# Patient Record
Sex: Male | Born: 1965 | Race: Black or African American | Hispanic: No | Marital: Single
Health system: Midwestern US, Community
[De-identification: ages and names within clinical notes are randomized; demographics above are authoritative.]

## PROBLEM LIST (undated history)

## (undated) DIAGNOSIS — F1910 Other psychoactive substance abuse, uncomplicated: Secondary | ICD-10-CM

## (undated) DIAGNOSIS — I10 Essential (primary) hypertension: Secondary | ICD-10-CM

## (undated) DIAGNOSIS — M1990 Unspecified osteoarthritis, unspecified site: Secondary | ICD-10-CM

## (undated) DIAGNOSIS — I219 Acute myocardial infarction, unspecified: Secondary | ICD-10-CM

## (undated) HISTORY — PX: NECK SURGERY: SHX720

---

## 2014-12-09 NOTE — ED Provider Notes (Signed)
PATIENT:          Jorge Noble, Jorge Noble         DOS:           12/09/2014  MR #:             [id number]             ACCOUNT #:     [id number]  DATE OF BIRTH:    July 17, 1966              AGE:           49      HISTORY OF PRESENT ILLNESS:    PERTINENT HISTORY OF PRESENT  ILLNESS. Patient was seen in  collaboration with  Dr. Jolayne Pantheronstant. Patient tripped and fell down stairs 6 hours ago,  lacerating his  right large toe.  States his tetanus is up to date.  Denies any other  symptoms  at this time.  Review of systems otherwise negative.        PHYSICAL EXAM Vitals within normal limits.  Heart is regular without  murmurs.   Lungs are clear bilaterally.  Abdomen soft and nontender.  Patient  does not  appear in any acute distress.  There seems to be no rash.  There are  no gross  focal neurological deficits.  Neck is supple.  Patient has full range  of motion  of the right large toe.  There is a crescent shaped flap of skin  approximately  3 cm in length.  Extremities neurovascularly intact with good  capillary refill.   The remainder of the physical exam is unremarkable.    MEDICAL DECISION MAKING:    SIGNIFICANT FINDINGS/ED COURSE/MEDICAL DECISION MAKING/TREATMENT  PLAN Tetanus  is up-to-date.  Patient given Norco for symptom management.  X-ray of  the right  foot shows no acute process.  Suture repair performed with 4-0 nylon  suture.  Wound was anesthetized with lidocaine, cleansed with Shur-Clens,  irrigated with  normal saline solution, and prepped with Betadine.  7 simple sutures  were  placed.  Patient tolerated well.  Bacitracin, dry sterile dressing,  postoperative shoe applied.  Patient received a work note.  He will  follow-up  with his primary care physician for further evaluation, treatment, and  suture  removal.  Results discussed in layman's terms to patient satisfaction.  All  questions answered in layman's terms.  Pt understands importance of  followup  care as directed.  The patient has been instructed to  return to ED to  if new  symptoms, problems, or questions.  We mutually agreed with the plan  of  disposition.    PROBLEM LIST:       Admit Reason:     Fall right tie lac: Entered Date: 09-Dec-2014 07:25, Entered By:  Standley BrookingINTERFACES,  INTERFACES, Status: Active        DIAGNOSIS 1.  Laceration, right large toe, 3 cm  2.  Suture repair        ADDITIONAL INFORMATION If the physician assistant/nurse practitioner  was  involved in patient care, I personally performed and participated in  all the  above services (including HPI and PE). I have reviewed with the  physician  assistant/nurse practitioner the history and confirmed the findings  with the  patient. I personally performed all surgical procedures in the medical  record  unless otherwise indicated.    Electronic Signatures:  CONSTANT, Ronda FairlyNICHOLAS J (MD)  (Signed 09-Dec-2014 16:52)   Authored: HISTORY OF  PRESENT ILLNESS, PHYSICAL EXAM, MEDICAL DECISION  MAKING   Co-Signer: HISTORY OF PRESENT ILLNESS, PHYSICAL EXAM, MEDICAL  DECISION MAKING,  PROBLEM LIST, DIAGNOSIS, Additional Infomation  Kendall FlackHENKELS, Felicha Frayne M (NP)  (Signed 09-Dec-2014 09:49)   Authored: HISTORY OF PRESENT ILLNESS, PHYSICAL EXAM, MEDICAL DECISION  MAKING,  PROBLEM LIST, DIAGNOSIS, Additional Infomation      Last Updated: 09-Dec-2014 16:52 by CONSTANT, Ronda FairlyNICHOLAS J (MD)            Please see T-Sheet, initial assessment, and physician orders for  further details.    Dictating Physician: Kendall FlackKatherine M Atlanta Pelto, CNP  Original Electronic Signature Date: 12/09/2014 07:52 A  [facility]  Document #: [id number]    cc:  PCP No       So[place]

## 2016-05-05 ENCOUNTER — Observation Stay (HOSPITAL_COMMUNITY)
Admission: EM | Admit: 2016-05-05 | Discharge: 2016-05-07 | Disposition: A | Discharge status: Home / Self Care | Payer: Medicaid - Out of State | Attending: Internal Medicine | Admitting: Internal Medicine

## 2016-05-05 ENCOUNTER — Encounter (HOSPITAL_COMMUNITY): Payer: Self-pay

## 2016-05-05 ENCOUNTER — Emergency Department (HOSPITAL_COMMUNITY): Payer: Medicaid - Out of State

## 2016-05-05 ENCOUNTER — Observation Stay (HOSPITAL_BASED_OUTPATIENT_CLINIC_OR_DEPARTMENT_OTHER): Payer: Medicaid - Out of State

## 2016-05-05 DIAGNOSIS — N183 Chronic kidney disease, stage 3 (moderate): Secondary | ICD-10-CM | POA: Insufficient documentation

## 2016-05-05 DIAGNOSIS — I429 Cardiomyopathy, unspecified: Secondary | ICD-10-CM | POA: Diagnosis not present

## 2016-05-05 DIAGNOSIS — F1410 Cocaine abuse, uncomplicated: Secondary | ICD-10-CM | POA: Diagnosis present

## 2016-05-05 DIAGNOSIS — Z833 Family history of diabetes mellitus: Secondary | ICD-10-CM | POA: Diagnosis not present

## 2016-05-05 DIAGNOSIS — I517 Cardiomegaly: Secondary | ICD-10-CM | POA: Diagnosis not present

## 2016-05-05 DIAGNOSIS — R079 Chest pain, unspecified: Secondary | ICD-10-CM | POA: Diagnosis present

## 2016-05-05 DIAGNOSIS — I248 Other forms of acute ischemic heart disease: Secondary | ICD-10-CM | POA: Diagnosis present

## 2016-05-05 DIAGNOSIS — I2489 Other forms of acute ischemic heart disease: Secondary | ICD-10-CM | POA: Diagnosis present

## 2016-05-05 DIAGNOSIS — I351 Nonrheumatic aortic (valve) insufficiency: Secondary | ICD-10-CM | POA: Diagnosis not present

## 2016-05-05 DIAGNOSIS — T405X1A Poisoning by cocaine, accidental (unintentional), initial encounter: Secondary | ICD-10-CM | POA: Insufficient documentation

## 2016-05-05 DIAGNOSIS — F17210 Nicotine dependence, cigarettes, uncomplicated: Secondary | ICD-10-CM | POA: Insufficient documentation

## 2016-05-05 DIAGNOSIS — G8929 Other chronic pain: Secondary | ICD-10-CM | POA: Diagnosis present

## 2016-05-05 DIAGNOSIS — M25512 Pain in left shoulder: Secondary | ICD-10-CM | POA: Diagnosis present

## 2016-05-05 DIAGNOSIS — R0600 Dyspnea, unspecified: Secondary | ICD-10-CM | POA: Insufficient documentation

## 2016-05-05 DIAGNOSIS — R778 Other specified abnormalities of plasma proteins: Secondary | ICD-10-CM | POA: Diagnosis present

## 2016-05-05 DIAGNOSIS — I160 Hypertensive urgency: Principal | ICD-10-CM | POA: Diagnosis present

## 2016-05-05 DIAGNOSIS — N179 Acute kidney failure, unspecified: Secondary | ICD-10-CM | POA: Diagnosis present

## 2016-05-05 DIAGNOSIS — E785 Hyperlipidemia, unspecified: Secondary | ICD-10-CM | POA: Diagnosis not present

## 2016-05-05 DIAGNOSIS — Z720 Tobacco use: Secondary | ICD-10-CM | POA: Diagnosis present

## 2016-05-05 DIAGNOSIS — I129 Hypertensive chronic kidney disease with stage 1 through stage 4 chronic kidney disease, or unspecified chronic kidney disease: Secondary | ICD-10-CM | POA: Insufficient documentation

## 2016-05-05 DIAGNOSIS — K5900 Constipation, unspecified: Secondary | ICD-10-CM | POA: Insufficient documentation

## 2016-05-05 DIAGNOSIS — R7989 Other specified abnormal findings of blood chemistry: Secondary | ICD-10-CM | POA: Diagnosis present

## 2016-05-05 DIAGNOSIS — Z8249 Family history of ischemic heart disease and other diseases of the circulatory system: Secondary | ICD-10-CM | POA: Diagnosis not present

## 2016-05-05 DIAGNOSIS — Z79899 Other long term (current) drug therapy: Secondary | ICD-10-CM | POA: Diagnosis not present

## 2016-05-05 DIAGNOSIS — I10 Essential (primary) hypertension: Secondary | ICD-10-CM

## 2016-05-05 HISTORY — DX: Essential (primary) hypertension: I10

## 2016-05-05 LAB — CBC
HCT: 41.5 % (ref 39.0–52.0)
HEMOGLOBIN: 14.2 g/dL (ref 13.0–17.0)
MCH: 28.6 pg (ref 26.0–34.0)
MCHC: 34.2 g/dL (ref 30.0–36.0)
MCV: 83.7 fL (ref 78.0–100.0)
Platelets: 297 10*3/uL (ref 150–400)
RBC: 4.96 MIL/uL (ref 4.22–5.81)
RDW: 16.5 % — ABNORMAL HIGH (ref 11.5–15.5)
WBC: 6.7 10*3/uL (ref 4.0–10.5)

## 2016-05-05 LAB — BASIC METABOLIC PANEL
ANION GAP: 8 (ref 5–15)
BUN: 21 mg/dL — ABNORMAL HIGH (ref 6–20)
CALCIUM: 9.6 mg/dL (ref 8.9–10.3)
CO2: 24 mmol/L (ref 22–32)
Chloride: 107 mmol/L (ref 101–111)
Creatinine, Ser: 2.29 mg/dL — ABNORMAL HIGH (ref 0.61–1.24)
GFR calc non Af Amer: 32 mL/min — ABNORMAL LOW (ref >60)
GFR, EST AFRICAN AMERICAN: 37 mL/min — AB (ref >60)
Glucose, Bld: 94 mg/dL (ref 65–99)
Potassium: 4.3 mmol/L (ref 3.5–5.1)
Sodium: 139 mmol/L (ref 135–145)

## 2016-05-05 LAB — TROPONIN I
TROPONIN I: 0.13 ng/mL — AB (ref <0.03)
TROPONIN I: 0.13 ng/mL — AB (ref <0.03)

## 2016-05-05 LAB — ECHOCARDIOGRAM COMPLETE
HEIGHTINCHES: 72 in
Weight: 4400 oz

## 2016-05-05 LAB — I-STAT TROPONIN, ED: TROPONIN I, POC: 0.04 ng/mL (ref 0.00–0.08)

## 2016-05-05 MED ORDER — ATORVASTATIN CALCIUM 10 MG PO TABS
20.0000 mg | ORAL_TABLET | Freq: Every day | ORAL | Status: DC
  Administered 2016-05-05 – 2016-05-07 (×3): 20 mg via ORAL
  Filled 2016-05-05 (×3): qty 2

## 2016-05-05 MED ORDER — LISINOPRIL 20 MG PO TABS
20.0000 mg | ORAL_TABLET | Freq: Every day | ORAL | Status: DC
  Filled 2016-05-05 (×2): qty 1

## 2016-05-05 MED ORDER — HYDROCODONE-ACETAMINOPHEN 5-325 MG PO TABS
1.0000 | ORAL_TABLET | Freq: Once | ORAL | Status: AC
  Administered 2016-05-05: 1 via ORAL
  Filled 2016-05-05: qty 1

## 2016-05-05 MED ORDER — NITROGLYCERIN 2 % TD OINT
1.0000 [in_us] | TOPICAL_OINTMENT | Freq: Four times a day (QID) | TRANSDERMAL | Status: DC
  Administered 2016-05-05 – 2016-05-06 (×3): 1 [in_us] via TOPICAL
  Filled 2016-05-05: qty 30

## 2016-05-05 MED ORDER — NITROGLYCERIN 2 % TD OINT
1.0000 [in_us] | TOPICAL_OINTMENT | Freq: Once | TRANSDERMAL | Status: AC
  Administered 2016-05-05: 1 [in_us] via TOPICAL
  Filled 2016-05-05: qty 1

## 2016-05-05 MED ORDER — ENOXAPARIN SODIUM 40 MG/0.4ML ~~LOC~~ SOLN
40.0000 mg | SUBCUTANEOUS | Status: DC
  Administered 2016-05-05 – 2016-05-06 (×2): 40 mg via SUBCUTANEOUS
  Filled 2016-05-05 (×2): qty 0.4

## 2016-05-05 MED ORDER — MORPHINE SULFATE (PF) 2 MG/ML IV SOLN
2.0000 mg | INTRAVENOUS | Status: DC | PRN
  Administered 2016-05-05 – 2016-05-07 (×4): 2 mg via INTRAVENOUS
  Filled 2016-05-05 (×4): qty 1

## 2016-05-05 MED ORDER — HYDRALAZINE HCL 25 MG PO TABS
25.0000 mg | ORAL_TABLET | Freq: Once | ORAL | Status: AC
  Administered 2016-05-05: 25 mg via ORAL
  Filled 2016-05-05: qty 1

## 2016-05-05 MED ORDER — ONDANSETRON HCL 4 MG/2ML IJ SOLN: 4.0000 mg | Freq: Four times a day (QID) | INTRAMUSCULAR | Status: DC | PRN

## 2016-05-05 MED ORDER — CLONIDINE HCL 0.1 MG PO TABS
0.2000 mg | ORAL_TABLET | Freq: Two times a day (BID) | ORAL | Status: DC
  Administered 2016-05-05 – 2016-05-06 (×3): 0.2 mg via ORAL
  Filled 2016-05-05 (×3): qty 2

## 2016-05-05 MED ORDER — HYDRALAZINE HCL 25 MG PO TABS
25.0000 mg | ORAL_TABLET | Freq: Four times a day (QID) | ORAL | Status: DC | PRN
  Administered 2016-05-05: 25 mg via ORAL
  Filled 2016-05-05: qty 1

## 2016-05-05 MED ORDER — ASPIRIN 81 MG PO CHEW
324.0000 mg | CHEWABLE_TABLET | Freq: Once | ORAL | Status: AC
  Administered 2016-05-05: 324 mg via ORAL
  Filled 2016-05-05: qty 4

## 2016-05-05 MED ORDER — OXYCODONE-ACETAMINOPHEN 5-325 MG PO TABS
1.0000 | ORAL_TABLET | Freq: Four times a day (QID) | ORAL | Status: DC | PRN
  Administered 2016-05-05 – 2016-05-06 (×4): 1 via ORAL
  Filled 2016-05-05 (×4): qty 1

## 2016-05-05 MED ORDER — AMLODIPINE BESYLATE 10 MG PO TABS
10.0000 mg | ORAL_TABLET | Freq: Every day | ORAL | Status: DC
  Administered 2016-05-05 – 2016-05-07 (×3): 10 mg via ORAL
  Filled 2016-05-05: qty 1
  Filled 2016-05-05: qty 2
  Filled 2016-05-05: qty 1

## 2016-05-05 MED ORDER — ACETAMINOPHEN 325 MG PO TABS: 650.0000 mg | ORAL_TABLET | ORAL | Status: DC | PRN

## 2016-05-05 MED ORDER — HYDRALAZINE HCL 50 MG PO TABS
50.0000 mg | ORAL_TABLET | Freq: Four times a day (QID) | ORAL | Status: DC
  Administered 2016-05-05 – 2016-05-06 (×3): 50 mg via ORAL
  Filled 2016-05-05 (×3): qty 1

## 2016-05-05 NOTE — Progress Notes (Signed)
*  PRELIMINARY RESULTS* Echocardiogram 2D Echocardiogram has been performed.  [name], Louis Yang 05/05/2016, 2:57 PM

## 2016-05-05 NOTE — ED Notes (Signed)
Pt can go to floor at 11:47

## 2016-05-05 NOTE — Progress Notes (Signed)
CRITICAL VALUE ALERT  Critical value received:  Troponin 0.13  Date of notification: 05/05/16  Time of notification:  1440  Critical value read back:Yes.    Nurse who received alert:  D.Levii Hairfield,RN  MD notified (1st page):  Dr. Sharl MaLama  Time of first page:  1440  MD notified (2nd page):  Time of second page:  Responding MD:  Dr. Sharl MaLama  Time MD responded:  [phone]

## 2016-05-05 NOTE — H&P (Signed)
[facility] H&P    Patient Demographics:    Louis Yang, is a 50 y.o. male  MRN: [id number]  DOB - 01/11/1966  Admit Date - 05/05/2016  Referring MD/NP/PA: Dr Clarene DukeLittle  Outpatient Primary MD for the patient is No primary care provider on file.  Patient coming from: home   Chief Complaint  Patient presents with  . Chest Pain      HPI:    Louis Yang  is a 50 y.o. male, with h/o hypertension, CKD stage III who came to the hospital with chest pain started this morning. Also patient has been complaining of shortness of breath for past 2 days. Patient has a history of hypertension and is on antihypertensive treatment. He has moved from [place] recently. He has been out of his medications for past 1 week. He also complains of left shoulder pain, x-ray of the shoulder shows arthropathy. He denies nausea had one episode of vomiting 2 days ago. Denies diarrhea. Does complain of bloating Denies dysuria, no fever.    Review of systems:    In addition to the HPI above,  No Fever-chills, No Headache, No changes with Vision or hearing, No problems swallowing food or Liquids, No Blood in stool or Urine, No dysuria, No new skin rashes or bruises, No new joints pains-aches,  No new weakness, tingling, numbness in any extremity, No recent weight gain or loss, No polyuria, polydypsia or polyphagia, No significant Mental Stressors.  A full 10 point Review of Systems was done, except as stated above, all other Review of Systems were negative.   With Past History of the following :    Past Medical History:  Diagnosis Date  . Hypertension       Past Surgical History:  Procedure Laterality Date  . NECK SURGERY        Social History:      Social History  Substance Use Topics  . Smoking status: Current Every Day Smoker    Types: Cigarettes  . Smokeless tobacco: Never Used  . Alcohol use Yes   Comment: social       Family History :     Family History  Problem Relation Age of Onset  . Hypertension Mother   . Hypertension Father   . Diabetes Father       Home Medications:   Prior to Admission medications   Medication Sig Start Date End Date Taking? Authorizing Provider  amLODipine (NORVASC) 10 MG tablet Take 10 mg by mouth daily.   Yes Historical Provider, MD  cloNIDine (CATAPRES) 0.2 MG tablet Take 0.2 mg by mouth 2 (two) times daily.   Yes Historical Provider, MD  atorvastatin (LIPITOR) 20 MG tablet Take 20 mg by mouth daily.    Historical Provider, MD  lisinopril (PRINIVIL,ZESTRIL) 20 MG tablet Take 20 mg by mouth daily.    Historical Provider, MD     Allergies:    No Known Allergies   Physical Exam:   Vitals  Blood pressure (!) 192/122, pulse 65, temperature 98.5 F (36.9 C), temperature source Oral, resp.  rate 15, SpO2 100 %.  1.  General: Well-built African-American male in no acute distress  2. Psychiatric:  Intact judgement and  insight, awake alert, oriented x 3.  3. Neurologic: No focal neurological deficits, all cranial nerves intact.Strength 5/5 all 4 extremities, sensation intact all 4 extremities, plantars down going.  4. Eyes :  anicteric sclerae, moist conjunctivae with no lid lag. PERRLA.  5. ENMT:  Oropharynx clear with moist mucous membranes and good dentition  6. Neck:  supple, no cervical lymphadenopathy appriciated, No thyromegaly  7. Respiratory : Normal respiratory effort, good air movement bilaterally,clear to  auscultation bilaterally  8. Cardiovascular : RRR, no gallops, rubs or murmurs, no leg edema  9. Gastrointestinal:  Positive bowel sounds, abdomen soft, non-tender to palpation,no hepatosplenomegaly, no rigidity or guarding       10. Skin:  No cyanosis, normal texture and turgor, no rash, lesions or ulcers  11.Musculoskeletal:  Left shoulder range of motion limited on abduction, left shoulder joint tender to  palpation    Data Review:    CBC  Recent Labs Lab 05/05/16 0830  WBC 6.7  HGB 14.2  HCT 41.5  PLT 297  MCV 83.7  MCH 28.6  MCHC 34.2  RDW 16.5*   ------------------------------------------------------------------------------------------------------------------  Chemistries   Recent Labs Lab 05/05/16 0830  NA 139  K 4.3  CL 107  CO2 24  GLUCOSE 94  BUN 21*  CREATININE 2.29*  CALCIUM 9.6   ------------------------------------------------------------------------------------------------------------------  ------------------------------------------------------------------------------------------------------------------ GFR: CrCl cannot be calculated (Unknown ideal weight.). L --------------------------------------------------------------------------------------------------------------- Urine analysis: No results found for: COLORURINE, APPEARANCEUR, LABSPEC, PHURINE, GLUCOSEU, HGBUR, BILIRUBINUR, KETONESUR, PROTEINUR, UROBILINOGEN, NITRITE, LEUKOCYTESUR    Imaging Results:    Dg Chest 2 View  Result Date: 05/05/2016 CLINICAL DATA:  Acute mid to left chest pain with cough EXAM: CHEST  2 VIEW COMPARISON:  None available FINDINGS: Mild cardiac enlargement without focal pneumonia, edema, CHF, effusion or pneumothorax. Trachea is midline. Aorta is elongated and tortuous. No acute osseous finding. Minor degenerative spondylosis of the spine. IMPRESSION: Mild cardiomegaly without acute chest process Electronically Signed   By: Judie Petit.  Shick M.D.   On: 05/05/2016 09:04   Dg Shoulder Left  Result Date: 05/05/2016 CLINICAL DATA:  Progressive pain and decreased range of motion EXAM: LEFT SHOULDER - 2+ VIEW COMPARISON:  January 18, 2016 FINDINGS: Frontal, Y scapular, and axillary images were obtained. There is no evident acute fracture or dislocation. A Hill-Sachs defect is noted in the lateral humeral head region, best seen on the axillary image, stable. 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 The glenohumeral joint appears unremarkable. Visualized left lung is clear. 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 Hill-Sachs defect consistent with prior anterior dislocation. No acute fracture or dislocation. Electronically Signed   By: Bretta Bang III M.D.   On: 05/05/2016 10:39    My personal review of EKG: Rhythm NSR, T-wave inversions in lead V4 V5 V6   & Plan:    Active Problems:   Chest pain   1. Chest pain- placed under observation, cycle cardiac enzymes. Patient has a history of cocaine abuse, did cocaine 3 days ago. We will obtain echocardiogram in a.m. start Nitropaste 1 inch every 6 hours 2. Left shoulder pain-x-ray of shoulder showed arthropathy, no fracture or dislocation. 3. Hypertension- continue amlodipine, Catapres. Will need adjustment of medications before discharge 4. Patient has no PCP and will need an appointment [facility] before discharge   DVT Prophylaxis-   Lovenox   AM Labs Ordered, also please review Full Orders  Family Communication: No family at bedside  Code Status:  Full code  Admission status: Observation  Time spent in minutes : 60 min   Evadene Wardrip S M.D on 05/05/2016 at 12:03 PM  Between 7am to 7pm - Pager - [phone]. After 7pm go to [email or web address] - password [facility]  [facility] - Office  [phone]

## 2016-05-05 NOTE — ED Provider Notes (Signed)
[facility]-EMERGENCY DEPT Provider Note   CSN: [id number] Arrival date & time: 05/05/16  0751     History   Chief Complaint Chief Complaint  Patient presents with  . Chest Pain    HPI Louis Yang is a 50 y.o. male.  50 year old male with past medical history including hypertension and substance abuse who presents with chest pain and left shoulder pain. The patient reports a long-standing history of chronic left shoulder pain. He had a steroid injection in the past which helped temporarily but recently his shoulder pain has been worse. 2 days ago while he was mowing the lawn, he had a gradual onset of worsening left-sided chest pain which she describes as a pressure with some pinching sensation. The pain has been intermittent and associated with some shortness of breath. He also reports some bloating sensation in his abdomen and early satiety recently. He has had increased urination recently but no dysuria. He has been out of his blood pressure medications for the past one week. He had one episode of vomiting yesterday, no diarrhea but he does endorse constipation. Last bowel movement was this morning. He admits to cocaine use, last use was 3 days ago. He states that on the day that his chest pain began he had not used any cocaine and has not used any since it started. No family history of heart disease. No recent travel, leg swelling, history of blood clots, or history of cancer.   The history is provided by the patient.    Past Medical History:  Diagnosis Date  . Hypertension     There are no active problems to display for this patient.   History reviewed. No pertinent surgical history.     Home Medications    Prior to Admission medications   Not on File    Family History History reviewed. No pertinent family history.  Social History Social History  Substance Use Topics  . Smoking status: Current Every Day Smoker  . Smokeless tobacco: Never Used  . Alcohol use Yes    Comment: social     Allergies   Review of patient's allergies indicates no known allergies.   Review of Systems Review of Systems 10 Systems reviewed and are negative for acute change except as noted in the HPI.   Physical Exam Updated Vital Signs BP (!) 185/123   Pulse 66   Temp 98.5 F (36.9 C) (Oral)   Resp 12   SpO2 100%   Physical Exam  Constitutional: He is oriented to person, place, and time. He appears well-developed and well-nourished. No distress.  HENT:  Head: Normocephalic and atraumatic.  Moist mucous membranes  Eyes: Conjunctivae are normal. Pupils are equal, round, and reactive to light.  Neck: Neck supple.  Cardiovascular: Normal rate, regular rhythm, normal heart sounds and intact distal pulses.   No murmur heard. Pulmonary/Chest: Effort normal and breath sounds normal.  Abdominal: Soft. Bowel sounds are normal. He exhibits no distension. There is no tenderness.  Musculoskeletal: He exhibits edema (trace BLE).  Limited ROM L shoulder due to pain; TTP anterior L shoulder without deformity, warmth, swelling  Neurological: He is alert and oriented to person, place, and time.  Fluent speech  Skin: Skin is warm and dry.  Psychiatric: He has a normal mood and affect. Judgment normal.  Nursing note and vitals reviewed.    ED Treatments / Results  Labs (all labs ordered are listed, but only abnormal results are displayed) Labs Reviewed  BASIC METABOLIC PANEL - Abnormal;  Notable for the following:       Result Value   BUN 21 (*)    Creatinine, Ser 2.29 (*)    GFR calc non Af Amer 32 (*)    GFR calc Af Amer 37 (*)    All other components within normal limits  CBC - Abnormal; Notable for the following:    RDW 16.5 (*)    All other components within normal limits  I-STAT TROPOININ, ED    EKG  EKG Interpretation  Date/Time:  Wednesday May 05 2016 08:04:49 EDT Ventricular Rate:  77 PR Interval:    QRS Duration: 184 QT Interval:  520 QTC  Calculation: 589 R Axis:   80 Text Interpretation:  Ectopic atrial rhythm Short PR interval Nonspecific intraventricular conduction delay Borderline repol abnormality, diffuse leads T wave inversions in lateral leads, no previous ekg available Confirmed by Tishawna Larouche MD, Ulrick Methot [phone]([id number]) on 05/05/2016 8:15:20 AM       Radiology Dg Chest 2 View  Result Date: 05/05/2016 CLINICAL DATA:  Acute mid to left chest pain with cough EXAM: CHEST  2 VIEW COMPARISON:  None available FINDINGS: Mild cardiac enlargement without focal pneumonia, edema, CHF, effusion or pneumothorax. Trachea is midline. Aorta is elongated and tortuous. No acute osseous finding. Minor degenerative spondylosis of the spine. IMPRESSION: Mild cardiomegaly without acute chest process Electronically Signed   By: Judie PetitM.  Shick M.D.   On: 05/05/2016 09:04    Procedures Procedures (including critical care time)  Medications Ordered in ED Medications  aspirin chewable tablet 324 mg (324 mg Oral Given 05/05/16 0827)  nitroGLYCERIN (NITROGLYN) 2 % ointment 1 inch (1 inch Topical Given 05/05/16 1001)  HYDROcodone-acetaminophen (NORCO/VICODIN) 5-325 MG per tablet 1 tablet (1 tablet Oral Given 05/05/16 1000)     Initial Impression / Assessment and Plan / ED Course  I have reviewed the triage vital signs and the nursing notes.  Pertinent labs & imaging results that were available during my care of the patient were reviewed by me and considered in my medical decision making (see chart for details).  Clinical Course   Pt w/ 2 days of intermittent L CP assoc w/ shortness of breath. Does admit to cocaine but denies any cocaine use around the onset of his chest pain. He was awake and alert, comfortable on exam. Vital signs notable for hypertension at 185/123. Reported his pain was currently 5/10. EKG with T-wave inversions in lateral leads, no previous available for comparison. Labs show negative troponin, creatinine 2.29 with no previous available for  comparison. Gave the patient Nitropaste and Norco for his shoulder pain. I'm concerned about the patient's elevated creatinine, elevated blood pressure, and abnormal EKG in the setting of chest pain. I have no previous documentation to compare to as the patient recently moved here. He is at higher risk for ACS given uncontrolled BP, cocaine use, and age. PE unlikely as he has no risk factors and O2 100% on RA. Discussed with Dr. Sharl MaLama, hospitalist, and pt admitted for cardiac work up.  Final Clinical Impressions(s) / ED Diagnoses   Final diagnoses:  Chest pain, unspecified type  Chronic left shoulder pain  Hypertension, unspecified type  AKI (acute kidney injury) [facility](HCC)    New Prescriptions New Prescriptions   No medications on file     Laurence Spatesachel Morgan Lorain Fettes, MD 05/05/16 1424

## 2016-05-05 NOTE — ED Triage Notes (Addendum)
Chest pain x 2 days.  Down left shoulder. Shortness of breath.  Pt states pain and bloating in abdomen. Denies physical exertion.  Some slight cough.  bp high in triage.  Pt has not been taking his meds.

## 2016-05-06 DIAGNOSIS — R748 Abnormal levels of other serum enzymes: Secondary | ICD-10-CM

## 2016-05-06 DIAGNOSIS — N179 Acute kidney failure, unspecified: Secondary | ICD-10-CM

## 2016-05-06 DIAGNOSIS — I160 Hypertensive urgency: Secondary | ICD-10-CM | POA: Diagnosis present

## 2016-05-06 DIAGNOSIS — E785 Hyperlipidemia, unspecified: Secondary | ICD-10-CM | POA: Diagnosis present

## 2016-05-06 DIAGNOSIS — R7989 Other specified abnormal findings of blood chemistry: Secondary | ICD-10-CM | POA: Diagnosis present

## 2016-05-06 DIAGNOSIS — G8929 Other chronic pain: Secondary | ICD-10-CM | POA: Diagnosis present

## 2016-05-06 DIAGNOSIS — E784 Other hyperlipidemia: Secondary | ICD-10-CM

## 2016-05-06 DIAGNOSIS — I2489 Other forms of acute ischemic heart disease: Secondary | ICD-10-CM | POA: Diagnosis present

## 2016-05-06 DIAGNOSIS — R079 Chest pain, unspecified: Secondary | ICD-10-CM | POA: Diagnosis not present

## 2016-05-06 DIAGNOSIS — F1410 Cocaine abuse, uncomplicated: Secondary | ICD-10-CM | POA: Diagnosis present

## 2016-05-06 DIAGNOSIS — I517 Cardiomegaly: Secondary | ICD-10-CM

## 2016-05-06 DIAGNOSIS — I248 Other forms of acute ischemic heart disease: Secondary | ICD-10-CM | POA: Diagnosis present

## 2016-05-06 DIAGNOSIS — M25512 Pain in left shoulder: Secondary | ICD-10-CM

## 2016-05-06 DIAGNOSIS — T405X1A Poisoning by cocaine, accidental (unintentional), initial encounter: Secondary | ICD-10-CM | POA: Diagnosis not present

## 2016-05-06 DIAGNOSIS — Z720 Tobacco use: Secondary | ICD-10-CM | POA: Diagnosis present

## 2016-05-06 DIAGNOSIS — R778 Other specified abnormalities of plasma proteins: Secondary | ICD-10-CM | POA: Diagnosis present

## 2016-05-06 LAB — URINE MICROSCOPIC-ADD ON
Bacteria, UA: NONE SEEN
RBC / HPF: NONE SEEN RBC/hpf (ref 0–5)
Squamous Epithelial / LPF: NONE SEEN

## 2016-05-06 LAB — BASIC METABOLIC PANEL
ANION GAP: 5 (ref 5–15)
BUN: 28 mg/dL — AB (ref 6–20)
CO2: 29 mmol/L (ref 22–32)
Calcium: 9.6 mg/dL (ref 8.9–10.3)
Chloride: 107 mmol/L (ref 101–111)
Creatinine, Ser: 2.27 mg/dL — ABNORMAL HIGH (ref 0.61–1.24)
GFR calc Af Amer: 37 mL/min — ABNORMAL LOW (ref >60)
GFR, EST NON AFRICAN AMERICAN: 32 mL/min — AB (ref >60)
GLUCOSE: 84 mg/dL (ref 65–99)
POTASSIUM: 3.9 mmol/L (ref 3.5–5.1)
Sodium: 141 mmol/L (ref 135–145)

## 2016-05-06 LAB — URINALYSIS, ROUTINE W REFLEX MICROSCOPIC
BILIRUBIN URINE: NEGATIVE
Glucose, UA: NEGATIVE mg/dL
HGB URINE DIPSTICK: NEGATIVE
Ketones, ur: NEGATIVE mg/dL
Leukocytes, UA: NEGATIVE
NITRITE: NEGATIVE
PROTEIN: 30 mg/dL — AB
Specific Gravity, Urine: 1.02 (ref 1.005–1.030)
pH: 6 (ref 5.0–8.0)

## 2016-05-06 LAB — SODIUM, URINE, RANDOM: SODIUM UR: 123 mmol/L

## 2016-05-06 LAB — CK: Total CK: 115 U/L (ref 49–397)

## 2016-05-06 LAB — CREATININE, URINE, RANDOM: Creatinine, Urine: 185.2 mg/dL

## 2016-05-06 LAB — TROPONIN I: TROPONIN I: 0.13 ng/mL — AB (ref <0.03)

## 2016-05-06 MED ORDER — CLONIDINE HCL 0.1 MG PO TABS
0.2000 mg | ORAL_TABLET | Freq: Three times a day (TID) | ORAL | Status: DC
  Administered 2016-05-06: 0.2 mg via ORAL
  Filled 2016-05-06: qty 2

## 2016-05-06 MED ORDER — HYDRALAZINE HCL 100 MG PO TABS: 100.0000 mg | ORAL_TABLET | Freq: Three times a day (TID) | ORAL | 0 refills | Status: DC

## 2016-05-06 MED ORDER — ASPIRIN EC 325 MG PO TBEC
325.0000 mg | DELAYED_RELEASE_TABLET | Freq: Every day | ORAL | Status: DC
  Administered 2016-05-06 – 2016-05-07 (×2): 325 mg via ORAL
  Filled 2016-05-06 (×2): qty 1

## 2016-05-06 MED ORDER — HYDRALAZINE HCL 20 MG/ML IJ SOLN
10.0000 mg | Freq: Four times a day (QID) | INTRAMUSCULAR | Status: DC | PRN
  Administered 2016-05-06 – 2016-05-07 (×2): 10 mg via INTRAVENOUS
  Filled 2016-05-06 (×2): qty 1

## 2016-05-06 MED ORDER — CLONIDINE HCL 0.2 MG PO TABS: 0.2000 mg | ORAL_TABLET | Freq: Two times a day (BID) | ORAL | 0 refills | Status: DC

## 2016-05-06 MED ORDER — ASPIRIN 81 MG PO CHEW: 81.0000 mg | CHEWABLE_TABLET | Freq: Every day | ORAL | 0 refills | Status: AC

## 2016-05-06 MED ORDER — AMLODIPINE BESYLATE 10 MG PO TABS: 10.0000 mg | ORAL_TABLET | Freq: Every day | ORAL | 0 refills | Status: DC

## 2016-05-06 MED ORDER — HYDRALAZINE HCL 50 MG PO TABS
100.0000 mg | ORAL_TABLET | Freq: Three times a day (TID) | ORAL | Status: DC
  Administered 2016-05-06 – 2016-05-07 (×3): 100 mg via ORAL
  Filled 2016-05-06 (×3): qty 2

## 2016-05-06 MED ORDER — ATORVASTATIN CALCIUM 20 MG PO TABS: 20.0000 mg | ORAL_TABLET | Freq: Every day | ORAL | 0 refills | Status: DC

## 2016-05-06 MED ORDER — NITROGLYCERIN 2 % TD OINT
1.0000 [in_us] | TOPICAL_OINTMENT | Freq: Three times a day (TID) | TRANSDERMAL | Status: DC
  Administered 2016-05-06 – 2016-05-07 (×2): 1 [in_us] via TOPICAL
  Filled 2016-05-06: qty 30

## 2016-05-06 NOTE — Discharge Instructions (Signed)
DASH Eating Plan  DASH stands for "Dietary Approaches to Stop Hypertension." The DASH eating plan is a healthy eating plan that has been shown to reduce high blood pressure (hypertension). Additional health benefits may include reducing the risk of type 2 diabetes mellitus, heart disease, and stroke. The DASH eating plan may also help with weight loss.  WHAT DO I NEED TO KNOW ABOUT THE DASH EATING PLAN?  For the DASH eating plan, you will follow these general guidelines:  · Choose foods with a percent daily value for sodium of less than 5% (as listed on the food label).  · Use salt-free seasonings or herbs instead of table salt or sea salt.  · Check with your health care provider or pharmacist before using salt substitutes.  · Eat lower-sodium products, often labeled as "lower sodium" or "no salt added."  · Eat fresh foods.  · Eat more vegetables, fruits, and low-fat dairy products.  · Choose whole grains. Look for the word "whole" as the first word in the ingredient list.  · Choose fish and skinless chicken or turkey more often than red meat. Limit fish, poultry, and meat to 6 oz (170 g) each day.  · Limit sweets, desserts, sugars, and sugary drinks.  · Choose heart-healthy fats.  · Limit cheese to 1 oz (28 g) per day.  · Eat more home-cooked food and less restaurant, buffet, and fast food.  · Limit fried foods.  · Cook foods using methods other than frying.  · Limit canned vegetables. If you do use them, rinse them well to decrease the sodium.  · When eating at a restaurant, ask that your food be prepared with less salt, or no salt if possible.  WHAT FOODS CAN I EAT?  Seek help from a dietitian for individual calorie needs.  Grains  Whole grain or whole wheat bread. Brown rice. Whole grain or whole wheat pasta. Quinoa, bulgur, and whole grain cereals. Low-sodium cereals. Corn or whole wheat flour tortillas. Whole grain cornbread. Whole grain crackers. Low-sodium crackers.  Vegetables  Fresh or frozen vegetables  (raw, steamed, roasted, or grilled). Low-sodium or reduced-sodium tomato and vegetable juices. Low-sodium or reduced-sodium tomato sauce and paste. Low-sodium or reduced-sodium canned vegetables.   Fruits  All fresh, canned (in natural juice), or frozen fruits.  Meat and Other Protein Products  Ground beef (85% or leaner), grass-fed beef, or beef trimmed of fat. Skinless chicken or turkey. Ground chicken or turkey. Pork trimmed of fat. All fish and seafood. Eggs. Dried beans, peas, or lentils. Unsalted nuts and seeds. Unsalted canned beans.  Dairy  Low-fat dairy products, such as skim or 1% milk, 2% or reduced-fat cheeses, low-fat ricotta or cottage cheese, or plain low-fat yogurt. Low-sodium or reduced-sodium cheeses.  Fats and Oils  Tub margarines without trans fats. Light or reduced-fat mayonnaise and salad dressings (reduced sodium). Avocado. Safflower, olive, or canola oils. Natural peanut or almond butter.  Other  Unsalted popcorn and pretzels.  The items listed above may not be a complete list of recommended foods or beverages. Contact your dietitian for more options.  WHAT FOODS ARE NOT RECOMMENDED?  Grains  White bread. White pasta. White rice. Refined cornbread. Bagels and croissants. Crackers that contain trans fat.  Vegetables  Creamed or fried vegetables. Vegetables in a cheese sauce. Regular canned vegetables. Regular canned tomato sauce and paste. Regular tomato and vegetable juices.  Fruits  Dried fruits. Canned fruit in light or heavy syrup. Fruit juice.  Meat and Other Protein   Products  Fatty cuts of meat. Ribs, chicken wings, bacon, sausage, bologna, salami, chitterlings, fatback, hot dogs, bratwurst, and packaged luncheon meats. Salted nuts and seeds. Canned beans with salt.  Dairy  Whole or 2% milk, cream, half-and-half, and cream cheese. Whole-fat or sweetened yogurt. Full-fat cheeses or blue cheese. Nondairy creamers and whipped toppings. Processed cheese, cheese spreads, or cheese  curds.  Condiments  Onion and garlic salt, seasoned salt, table salt, and sea salt. Canned and packaged gravies. Worcestershire sauce. Tartar sauce. Barbecue sauce. Teriyaki sauce. Soy sauce, including reduced sodium. Steak sauce. Fish sauce. Oyster sauce. Cocktail sauce. Horseradish. Ketchup and mustard. Meat flavorings and tenderizers. Bouillon cubes. Hot sauce. Tabasco sauce. Marinades. Taco seasonings. Relishes.  Fats and Oils  Butter, stick margarine, lard, shortening, ghee, and bacon fat. Coconut, palm kernel, or palm oils. Regular salad dressings.  Other  Pickles and olives. Salted popcorn and pretzels.  The items listed above may not be a complete list of foods and beverages to avoid. Contact your dietitian for more information.  WHERE CAN I FIND MORE INFORMATION?  National Heart, Lung, and [employer] Institute: www.nhlbi.nih.gov/health/health-topics/topics/dash/     This information is not intended to replace advice given to you by your health care provider. Make sure you discuss any questions you have with your health care provider.     Document Released: 07/08/2011 Document Revised: 08/09/2014 Document Reviewed: 05/23/2013  Elsevier Interactive Patient Education ©2016 [employer]

## 2016-05-06 NOTE — Progress Notes (Addendum)
Patient planned for discharged but again c/o 7/10 sharp chest pain over left anterior chest, non radiating. EKG showing new TWI in II, aVL and V3. Will cycle enzymes again. Ordered nitropaste and held discharge. Cardiology consulted for inpt eval.

## 2016-05-06 NOTE — Progress Notes (Signed)
Pt bp this am 183/101. On-call paged, order given for 10 mg hydralazine. Medication administered, bp came down to 160/90. Will continue to monitor.

## 2016-05-06 NOTE — Discharge Summary (Addendum)
Physician Discharge Summary  Rece Zechman ZOX:[id number] DOB: 01/09/1966 DOA: 05/05/2016  PCP: No primary care provider on file.  Admit date: 05/05/2016 Discharge date: 05/07/2016  Admitted From: Home Disposition:  Home  Recommendations for Outpatient Follow-up:  1. Follow up At [facility] on 05/25/2016. 2. Cardiology will arrange outpatient follow-up.  Home Health: None Equipment/Devices : none  Discharge Condition: Stable CODE STATUS: Full code Diet recommendation: Low sodium    Discharge Diagnoses:  Principal Problem:   Malignant hypertensive urgency   Active Problems:   Cocaine induced Chest pain   Acute kidney injury (HCC)   Tobacco abuse   Cocaine abuse   LVH (left ventricular hypertrophy)   Demand ischemia of myocardium (HCC)   Elevated troponin I level   Hyperlipidemia   Chronic left shoulder pain  Brief narrative/history of present illness 50 year old male with history of hypertension (nonadherent with medication),? CK D stage III, ongoing tobacco use and cocaine use presented to the ED with substernal chest pain since the morning of admission. He also was having dyspnea on exertion for the past 2 days. Patient was on antihypertensives but has not taken his medication for the past few weeks. He moved to [place] from [place] 4 months back. He also complains of chronic left shoulder pain. He denied any nausea but had an episode of vomiting 2 days ago, denied diarrhea, palpitations, abdominal pain, dysuria or fever. Last cocaine use was 4 days prior to admission.  In the ED he was found to have malignant hypertension with blood pressure as high as 193/136 mmHg. EKG showed T-wave inversion in lateral leads. Initial troponin was negative. Blood work showed creatinine of 2.29. He was given Nitropaste and Norco for his shoulder pain. Hospitalists consulted for observation.  Hospital course Chest pain Secondary to malignant hypertension..  Mildly elevated troponin (flat at 0.13). Stable on telemetry overnight. Repeat EKG unchanged with lateral T-wave inversion, prolonged QTC resolved. Discussed EKG findings with cardiology (Dr Anne Fu), who recommended optimal blood pressure control and that chest pain is likely due to uncontrolled pressure..  2-D echo showed LVH with EF of 55 and 60%, no wall motion abnormality. Also shows grade 1 diastolic dysfunction and high ventricle filling pressure, trivial AR and mildly  dilated ascending aorta.  Patient strongly counseled on smoking and cocaine cessation. Also counseled on diet restriction and adherence to his antihypertensives. -I started patient on baby aspirin. Resumed statin (given prescription).   Arranged outpatient follow-up with cardiology. Off note patient informs having a normal stress test within the past 1 year. Is not sure whether it was done and will try to get those records.  Malignant hypertension Likely combination of dietary and medication nonadherence, ongoing tobacco and cocaine use . Resumed amlodipine, increase dose of clonidine and added maximum dose of hydralazine. Next on blood pressure still elevated but improved. Patient counseled extensively. Plans to quit cocaine but does not want to quit smoking. Scheduled to establish care at [facility] on 10/24.  Acute versus acute on chronic kidney disease stage III Suspect hypertensive nephropathy. Mild proteinuria noted. Patient instructed to stay away from NSAIDs and avoid cocaine.  Elevated troponin Suspect demand ischemia from cocaine use and malignant hypertension.  Dyslipidemia Continue statin.  Chronic left shoulder pain X-ray shows prior anterior dislocation. No acute findings. Instructed to use Tylenol as needed for pain.  stable to be discharged home with outpatient follow-up.  Family communication: Fianc at bedside  Discharge Instructions  Medication List    STOP  taking these medications   lisinopril 20 MG tablet Commonly known as:  PRINIVIL,ZESTRIL     TAKE these medications   amLODipine 10 MG tablet Commonly known as:  NORVASC Take 1 tablet (10 mg total) by mouth daily.   aspirin 81 MG chewable tablet Commonly known as:  ASPIRIN CHILDRENS Chew 1 tablet (81 mg total) by mouth daily.   atorvastatin 20 MG tablet Commonly known as:  LIPITOR Take 1 tablet (20 mg total) by mouth daily.   cloNIDine 0.3 MG tablet Commonly known as:  CATAPRES Take 1 tablet (0.3 mg total) by mouth 3 (three) times daily. Total 90 tablets, no refills   hydrALAZINE 100 MG tablet Commonly known as:  APRESOLINE Take 1 tablet (100 mg total) by mouth every 8 (eight) hours.      Follow-up Information    [facility] Follow up on 05/25/2016.   Specialty:  Internal Medicine Why:  [facility] at 9:00 AM please arrive at 8:45 AM. Please keep appointment. Take ID, all medications with you and insurance card. If you miss this appointment you will not be able to get another appointment unit Nov.  Contact information: [address] [place] [id number] [phone]         No Known Allergies  Consultations:  None   Procedures/Studies: Dg Chest 2 View  Result Date: 05/05/2016 CLINICAL DATA:  Acute mid to left chest pain with cough EXAM: CHEST  2 VIEW COMPARISON:  None available FINDINGS: Mild cardiac enlargement without focal pneumonia, edema, CHF, effusion or pneumothorax. Trachea is midline. Aorta is elongated and tortuous. No acute osseous finding. Minor degenerative spondylosis of the spine. IMPRESSION: Mild cardiomegaly without acute chest process Electronically Signed   By: Judie PetitM.  Shick M.D.   On: 05/05/2016 09:04   Dg Shoulder Left  Result Date: 05/05/2016 CLINICAL DATA:  Progressive pain and decreased range of motion EXAM: LEFT SHOULDER - 2+ VIEW COMPARISON:  January 18, 2016 FINDINGS: Frontal, Y scapular, and axillary images  were obtained. There is no evident acute fracture or dislocation. A Hill-Sachs defect is noted in the lateral humeral head region, best seen on the axillary image, stable. 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 The glenohumeral joint appears unremarkable. Visualized left lung is clear. 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 Hill-Sachs defect consistent with prior anterior dislocation. No acute fracture or dislocation. Electronically Signed   By: Bretta BangWilliam  Woodruff III M.D.   On: 05/05/2016 10:39    2-D echo Study Conclusions  - Left ventricle: The cavity size was normal. Wall thickness was   increased in a pattern of severe LVH. Systolic function was   normal. The estimated ejection fraction was in the range of 55%   to 60%. Wall motion was normal; there were no regional wall   motion abnormalities. Doppler parameters are consistent with   abnormal left ventricular relaxation (grade 1 diastolic   dysfunction). Doppler parameters are consistent with high   ventricular filling pressure. - Aortic valve: There was trivial regurgitation. - Ascending aorta: The ascending aorta was mildly dilated. - Left atrium: The atrium was moderately dilated.  Impressions:  - Normal LV systolic function; severe LVH; grade 1 diastolic   dysfunction with elevated LV filling pressure; moderate LAE;   trace AI; mildly dilated ascending aorta (4.3 cm).  Subjective: Complain of right-sided chest discomfort radiating to his arm.  Appear musculoskeletal on exam. Resolved shortly.  Discharge Exam: Vitals:   05/06/16 0656 05/06/16 0907  BP: (!) 160/90 (!) 155/102  Pulse:    Resp:    Temp:     Vitals:   05/05/16 2103 05/06/16 0549 05/06/16 0656 05/06/16 0907  BP:  (!) 183/101 (!) 160/90 (!) 155/102  Pulse: 72 68    Resp: 18 16    Temp: 98.4 F (36.9 C) 98.1 F (36.7 C)    TempSrc:  Oral Oral    SpO2: 97% 93%    Weight:      Height:        General: Middle aged obese male not in distress HEENT: No pallor, moist because of, supple neck Cardiovascular: RRR, S1/S2 +, no rubs, no gallops Respiratory: CTA bilaterally, no wheezing, no rhonchi GI Soft, NT, ND, bowel sounds + Musculoskeletal: Warm, no edema CNS: Alert and oriented   The results of significant diagnostics from this hospitalization (including imaging, microbiology, ancillary and laboratory) are listed below for reference.     Microbiology: No results found for this or any previous visit (from the past 240 hour(s)).   Labs: BNP (last 3 results) No results for input(s): BNP in the last 8760 hours. Basic Metabolic Panel:  Recent Labs Lab 05/05/16 0830 05/06/16 1055  NA 139 141  K 4.3 3.9  CL 107 107  CO2 24 29  GLUCOSE 94 84  BUN 21* 28*  CREATININE 2.29* 2.27*  CALCIUM 9.6 9.6   Liver Function Tests: No results for input(s): AST, ALT, ALKPHOS, BILITOT, PROT, ALBUMIN in the last 168 hours. No results for input(s): LIPASE, AMYLASE in the last 168 hours. No results for input(s): AMMONIA in the last 168 hours. CBC:  Recent Labs Lab 05/05/16 0830  WBC 6.7  HGB 14.2  HCT 41.5  MCV 83.7  PLT 297   Cardiac Enzymes:  Recent Labs Lab 05/05/16 1315 05/05/16 1847 05/06/16 0059 05/06/16 1055  CKTOTAL  --   --   --  115  TROPONINI 0.13* 0.13* 0.13*  --    BNP: Invalid input(s): POCBNP CBG: No results for input(s): GLUCAP in the last 168 hours. D-Dimer No results for input(s): DDIMER in the last 72 hours. Hgb A1c No results for input(s): HGBA1C in the last 72 hours. Lipid Profile No results for input(s): CHOL, HDL, LDLCALC, TRIG, CHOLHDL, LDLDIRECT in the last 72 hours. Thyroid function studies No results for input(s): TSH, T4TOTAL, T3FREE, THYROIDAB in the last 72 hours.  Invalid input(s): FREET3 Anemia work up No results for input(s): VITAMINB12, FOLATE, FERRITIN, TIBC,  IRON, RETICCTPCT in the last 72 hours. Urinalysis No results found for: COLORURINE, APPEARANCEUR, LABSPEC, PHURINE, GLUCOSEU, HGBUR, BILIRUBINUR, KETONESUR, PROTEINUR, UROBILINOGEN, NITRITE, LEUKOCYTESUR Sepsis Labs Invalid input(s): PROCALCITONIN,  WBC,  LACTICIDVEN Microbiology No results found for this or any previous visit (from the past 240 hour(s)).   Time coordinating discharge: < 30 minutes  SIGNED:   Eddie North, MD  [facility] 05/06/2016, 1:52 PM Pager   If 7PM-7AM, please contact night-coverage [email or web address] Password [email or web address]

## 2016-05-06 NOTE — Progress Notes (Signed)
Discussed with cardiologist Dr. Anne FuSkains who reviewed his EKGs and recommended this likely associated with his uncontrolled hypertension and LVH. Recommended aggressively controlling his blood pressure.

## 2016-05-06 NOTE — Progress Notes (Signed)
Appointment at [facility] for Oct 24, at 9:00 AM.

## 2016-05-06 NOTE — Progress Notes (Signed)
Discharged home but started C/O left sided chest pain, Vitals done and pain med given and  Dr. Gonzella Lexhungel notified assessed patient and order written.  Pain med helped per patient, will continue to assess patient.

## 2016-05-07 LAB — BASIC METABOLIC PANEL
ANION GAP: 7 (ref 5–15)
BUN: 25 mg/dL — ABNORMAL HIGH (ref 6–20)
CHLORIDE: 109 mmol/L (ref 101–111)
CO2: 25 mmol/L (ref 22–32)
Calcium: 9.5 mg/dL (ref 8.9–10.3)
Creatinine, Ser: 2.1 mg/dL — ABNORMAL HIGH (ref 0.61–1.24)
GFR calc Af Amer: 41 mL/min — ABNORMAL LOW (ref >60)
GFR calc non Af Amer: 35 mL/min — ABNORMAL LOW (ref >60)
GLUCOSE: 131 mg/dL — AB (ref 65–99)
POTASSIUM: 3.7 mmol/L (ref 3.5–5.1)
Sodium: 141 mmol/L (ref 135–145)

## 2016-05-07 MED ORDER — CLONIDINE HCL 0.3 MG PO TABS
0.3000 mg | ORAL_TABLET | Freq: Three times a day (TID) | ORAL | Status: DC
  Administered 2016-05-07: 0.3 mg via ORAL
  Filled 2016-05-07: qty 1

## 2016-05-07 MED ORDER — CLONIDINE HCL 0.3 MG PO TABS: 0.3000 mg | ORAL_TABLET | Freq: Three times a day (TID) | ORAL | 0 refills | Status: DC

## 2016-05-07 NOTE — Progress Notes (Signed)
[facility] PROGRESS NOTE  Louis Yang ZOX:[id number] DOB: 07/02/1966 DOA: 05/05/2016 PCP: No primary care provider on file.  Assessment/Plan: Chest pain Associated with malignant hypertension. Resolved. No significant EKG change. Added baby aspirin. Continue statin   Malignant hypertension Continue amlodipine and hydralazine. Clonidine dose further increased to 0.3 mg 3 times a day. Again reemphasized and counseled on dietary adherence and medication compliance. Counseled on tobacco and cocaine cessation. Outpatient appointment arranged.  Acute versus acute on chronic kidney disease Possibly due to hypertensive nephropathy. Follow-up as outpatient.  Prolonged QTC Resolved on subsequent EKG  HPI/Subjective: Complained of right-sided chest discomfort radiating to the arm  Objective: Vitals:   05/07/16 0829 05/07/16 0948  BP: (!) 168/104 (!) 147/94  Pulse:    Resp:    Temp:      Intake/Output Summary (Last 24 hours) at 05/07/16 1253 Last data filed at 05/06/16 2209  Gross per 24 hour  Intake              480 ml  Output                0 ml  Net              480 ml   Filed Weights   05/05/16 1231  Weight: 124.7 kg (275 lb)    Exam:   General:  Not in distress  HEENT: Moist mucosa, supple neck  Cardiovascular: Normal S1 and S2, no murmurs  Respiratory: Clear bilaterally  Abdomen: Soft, nondistended, nontender  Musculoskeletal: Warm, no edema  Data Reviewed: Basic Metabolic Panel:  Recent Labs Lab 05/05/16 0830 05/06/16 1055 05/07/16 0822  NA 139 141 141  K 4.3 3.9 3.7  CL 107 107 109  CO2 24 29 25   GLUCOSE 94 84 131*  BUN 21* 28* 25*  CREATININE 2.29* 2.27* 2.10*  CALCIUM 9.6 9.6 9.5   Liver Function Tests: No results for input(s): AST, ALT, ALKPHOS, BILITOT, PROT, ALBUMIN in the last 168 hours. No results for input(s): LIPASE, AMYLASE in the last 168 hours. No results for input(s): AMMONIA in the last 168 hours. CBC:  Recent  Labs Lab 05/05/16 0830  WBC 6.7  HGB 14.2  HCT 41.5  MCV 83.7  PLT 297   Cardiac Enzymes:  Recent Labs Lab 05/05/16 1315 05/05/16 1847 05/06/16 0059 05/06/16 1055  CKTOTAL  --   --   --  115  TROPONINI 0.13* 0.13* 0.13*  --    BNP (last 3 results) No results for input(s): BNP in the last 8760 hours.  ProBNP (last 3 results) No results for input(s): PROBNP in the last 8760 hours.  CBG: No results for input(s): GLUCAP in the last 168 hours.  No results found for this or any previous visit (from the past 240 hour(s)).   Studies: No results found.  Scheduled Meds: . amLODipine  10 mg Oral Daily  . aspirin EC  325 mg Oral Daily  . atorvastatin  20 mg Oral Daily  . cloNIDine  0.3 mg Oral TID  . enoxaparin (LOVENOX) injection  40 mg Subcutaneous Q24H  . hydrALAZINE  100 mg Oral Q8H  . nitroGLYCERIN  1 inch Topical Q8H   Continuous Infusions:    Time spent: 25 minutes    Louis Yang  [facility] Pager [phone]. If 7PM-7AM, please contact night-coverage at [email or web address], password [email or web address] 05/07/2016, 12:53 PM  LOS: 0 days

## 2016-05-10 ENCOUNTER — Emergency Department (HOSPITAL_BASED_OUTPATIENT_CLINIC_OR_DEPARTMENT_OTHER): Payer: Medicaid - Out of State

## 2016-05-10 ENCOUNTER — Ambulatory Visit: Payer: Medicaid - Out of State | Admitting: Nurse Practitioner

## 2016-05-10 ENCOUNTER — Inpatient Hospital Stay (HOSPITAL_BASED_OUTPATIENT_CLINIC_OR_DEPARTMENT_OTHER)
Admission: EM | Admit: 2016-05-10 | Discharge: 2016-05-13 | DRG: 313 | Discharge status: Against Medical Advice | Payer: Medicaid - Out of State | Attending: Internal Medicine | Admitting: Internal Medicine

## 2016-05-10 ENCOUNTER — Encounter (HOSPITAL_BASED_OUTPATIENT_CLINIC_OR_DEPARTMENT_OTHER): Payer: Self-pay | Admitting: *Deleted

## 2016-05-10 DIAGNOSIS — I129 Hypertensive chronic kidney disease with stage 1 through stage 4 chronic kidney disease, or unspecified chronic kidney disease: Secondary | ICD-10-CM | POA: Diagnosis present

## 2016-05-10 DIAGNOSIS — F1410 Cocaine abuse, uncomplicated: Secondary | ICD-10-CM | POA: Diagnosis present

## 2016-05-10 DIAGNOSIS — R0789 Other chest pain: Principal | ICD-10-CM | POA: Diagnosis present

## 2016-05-10 DIAGNOSIS — F17210 Nicotine dependence, cigarettes, uncomplicated: Secondary | ICD-10-CM | POA: Diagnosis present

## 2016-05-10 DIAGNOSIS — R0602 Shortness of breath: Secondary | ICD-10-CM

## 2016-05-10 DIAGNOSIS — N183 Chronic kidney disease, stage 3 (moderate): Secondary | ICD-10-CM | POA: Diagnosis present

## 2016-05-10 DIAGNOSIS — R079 Chest pain, unspecified: Secondary | ICD-10-CM

## 2016-05-10 DIAGNOSIS — I10 Essential (primary) hypertension: Secondary | ICD-10-CM | POA: Diagnosis present

## 2016-05-10 DIAGNOSIS — Z79899 Other long term (current) drug therapy: Secondary | ICD-10-CM

## 2016-05-10 DIAGNOSIS — Z720 Tobacco use: Secondary | ICD-10-CM | POA: Diagnosis present

## 2016-05-10 DIAGNOSIS — R778 Other specified abnormalities of plasma proteins: Secondary | ICD-10-CM | POA: Diagnosis present

## 2016-05-10 DIAGNOSIS — E785 Hyperlipidemia, unspecified: Secondary | ICD-10-CM | POA: Diagnosis present

## 2016-05-10 DIAGNOSIS — R7989 Other specified abnormal findings of blood chemistry: Secondary | ICD-10-CM | POA: Diagnosis present

## 2016-05-10 DIAGNOSIS — F14929 Cocaine use, unspecified with intoxication, unspecified: Secondary | ICD-10-CM | POA: Diagnosis present

## 2016-05-10 DIAGNOSIS — R109 Unspecified abdominal pain: Secondary | ICD-10-CM

## 2016-05-10 LAB — CBC WITH DIFFERENTIAL/PLATELET
BASOS PCT: 1 %
Basophils Absolute: 0.1 10*3/uL (ref 0.0–0.1)
Eosinophils Absolute: 0.3 10*3/uL (ref 0.0–0.7)
Eosinophils Relative: 4 %
HEMATOCRIT: 36 % — AB (ref 39.0–52.0)
HEMOGLOBIN: 12.4 g/dL — AB (ref 13.0–17.0)
LYMPHS ABS: 2.2 10*3/uL (ref 0.7–4.0)
LYMPHS PCT: 30 %
MCH: 29.2 pg (ref 26.0–34.0)
MCHC: 34.4 g/dL (ref 30.0–36.0)
MCV: 84.7 fL (ref 78.0–100.0)
MONO ABS: 0.8 10*3/uL (ref 0.1–1.0)
MONOS PCT: 11 %
NEUTROS ABS: 4 10*3/uL (ref 1.7–7.7)
NEUTROS PCT: 54 %
Platelets: 296 10*3/uL (ref 150–400)
RBC: 4.25 MIL/uL (ref 4.22–5.81)
RDW: 16.6 % — ABNORMAL HIGH (ref 11.5–15.5)
WBC: 7.3 10*3/uL (ref 4.0–10.5)

## 2016-05-10 LAB — COMPREHENSIVE METABOLIC PANEL
ALBUMIN: 4 g/dL (ref 3.5–5.0)
ALT: 15 U/L — ABNORMAL LOW (ref 17–63)
ANION GAP: 10 (ref 5–15)
AST: 20 U/L (ref 15–41)
Alkaline Phosphatase: 63 U/L (ref 38–126)
BUN: 26 mg/dL — ABNORMAL HIGH (ref 6–20)
CHLORIDE: 107 mmol/L (ref 101–111)
CO2: 22 mmol/L (ref 22–32)
Calcium: 9.3 mg/dL (ref 8.9–10.3)
Creatinine, Ser: 2.16 mg/dL — ABNORMAL HIGH (ref 0.61–1.24)
GFR calc non Af Amer: 34 mL/min — ABNORMAL LOW (ref >60)
GFR, EST AFRICAN AMERICAN: 39 mL/min — AB (ref >60)
GLUCOSE: 87 mg/dL (ref 65–99)
POTASSIUM: 3.6 mmol/L (ref 3.5–5.1)
SODIUM: 139 mmol/L (ref 135–145)
Total Bilirubin: 0.4 mg/dL (ref 0.3–1.2)
Total Protein: 6.9 g/dL (ref 6.5–8.1)

## 2016-05-10 LAB — TROPONIN I: Troponin I: 0.11 ng/mL (ref <0.03)

## 2016-05-10 MED ORDER — MORPHINE SULFATE (PF) 4 MG/ML IV SOLN
4.0000 mg | Freq: Once | INTRAVENOUS | Status: AC
  Administered 2016-05-11: 4 mg via INTRAVENOUS
  Filled 2016-05-10: qty 1

## 2016-05-10 NOTE — ED Notes (Signed)
Patient denies any Cocaine use since leaving the hospital. States that he was unable to go to his Appointment today r/t a family emergency

## 2016-05-10 NOTE — Progress Notes (Deleted)
CARDIOLOGY OFFICE NOTE  Date:  05/10/2016    Burnell Blanks Date of Birth: 09-01-1965 Medical Record #[id number]  PCP:  No primary care provider on file.  Cardiologist:  Tyrone Sage & ***    No chief complaint on file.   History of Present Illness: Louis Yang is a 50 y.o. male who presents today for a ***   Comes in today. Here with   Past Medical History:  Diagnosis Date  . Hypertension     Past Surgical History:  Procedure Laterality Date  . NECK SURGERY       Medications: Current Outpatient Prescriptions  Medication Sig Dispense Refill  . amLODipine (NORVASC) 10 MG tablet Take 1 tablet (10 mg total) by mouth daily. 30 tablet 0  . aspirin (ASPIRIN CHILDRENS) 81 MG chewable tablet Chew 1 tablet (81 mg total) by mouth daily. 30 tablet 0  . atorvastatin (LIPITOR) 20 MG tablet Take 1 tablet (20 mg total) by mouth daily. 30 tablet 0  . cloNIDine (CATAPRES) 0.3 MG tablet Take 1 tablet (0.3 mg total) by mouth 3 (three) times daily. 90 tablet 0  . hydrALAZINE (APRESOLINE) 100 MG tablet Take 1 tablet (100 mg total) by mouth every 8 (eight) hours. 90 tablet 0   No current facility-administered medications for this visit.     Allergies: No Known Allergies  Social History: The patient  reports that he has been smoking Cigarettes.  He has never used smokeless tobacco. He reports that he drinks alcohol. He reports that he uses drugs, including Cocaine.   Family History: The patient's ***family history includes Diabetes in his father; Hypertension in his father and mother.   Review of Systems: Please see the history of present illness.   Otherwise, the review of systems is positive for {NONE DEFAULTED:18576::"none"}.   All other systems are reviewed and negative.   Physical Exam: VS:  There were no vitals taken for this visit. Marland Kitchen  BMI There is no height or weight on file to calculate BMI.  Wt Readings from Last 3 Encounters:  05/05/16 275 lb (124.7 kg)     General: Pleasant. Well developed, well nourished and in no acute distress.   HEENT: Normal.  Neck: Supple, no JVD, carotid bruits, or masses noted.  Cardiac: ***Regular rate and rhythm. No murmurs, rubs, or gallops. No edema.  Respiratory:  Lungs are clear to auscultation bilaterally with normal work of breathing.  GI: Soft and nontender.  MS: No deformity or atrophy. Gait and ROM intact.  Skin: Warm and dry. Color is normal.  Neuro:  Strength and sensation are intact and no gross focal deficits noted.  Psych: Alert, appropriate and with normal affect.   LABORATORY DATA:  EKG:  EKG {ACTION; IS/IS [id number]} ordered today. This demonstrates ***.  Lab Results  Component Value Date   WBC 6.7 05/05/2016   HGB 14.2 05/05/2016   HCT 41.5 05/05/2016   PLT 297 05/05/2016   GLUCOSE 131 (H) 05/07/2016   NA 141 05/07/2016   K 3.7 05/07/2016   CL 109 05/07/2016   CREATININE 2.10 (H) 05/07/2016   BUN 25 (H) 05/07/2016   CO2 25 05/07/2016    BNP (last 3 results) No results for input(s): BNP in the last 8760 hours.  ProBNP (last 3 results) No results for input(s): PROBNP in the last 8760 hours.   Other Studies Reviewed Today:   Assessment/Plan:   Current medicines are reviewed with the patient today.  The patient does not have concerns regarding  medicines other than what has been noted above.  The following changes have been made:  See above.  Labs/ tests ordered today include:   No orders of the defined types were placed in this encounter.    Disposition:   FU with *** in {gen number [id number]}[id number]:[id number]} {Days to years:10300}.   Patient is agreeable to this plan and will call if any problems develop in the interim.   Signed: Rosalio MacadamiaLori C. Gwendalyn Mcgonagle, RN, ANP-C 05/10/2016 2:48 PM  [facility] [address] [place]  [address] Phone: [phone] Fax: [phone]

## 2016-05-10 NOTE — ED Triage Notes (Signed)
Chest pain and sob. [facility] work[facility].

## 2016-05-10 NOTE — ED Provider Notes (Signed)
[facility] Provider Note   CSN: [id number] Arrival date & time: 05/10/16  2107  By signing my name below, I, Louis Yang, attest that this documentation has been prepared under the direction and in the presence of Louis Lyonsouglas Heman Que, MD . Electronically Signed: Teofilo PodMatthew P. Yang, ED Scribe. 05/10/2016. 10:25 PM.    History   Chief Complaint Chief Complaint  Patient presents with  . Chest Pain    The history is provided by the patient. No language interpreter was used.  Chest Pain   This is a new problem. Episode onset: 1 week ago. The problem occurs daily. The problem has not changed since onset.The pain is associated with eating. The pain is moderate. The pain radiates to the right arm. Associated symptoms include shortness of breath. He has tried nothing for the symptoms.   HPI Comments:  Louis Yang is a 50 y.o. male with PMHx of HTN who presents to the Emergency Department complaining of intermittent episodes of chest pain since last week. Pt reports associated numbness in right hand with pain that radiates up his arm, down his right leg and across his chest. Pt also complains of associated flatus, SOB and states that he left eye lid twitches intermittently. Pt was admitted to the ED last week with similar symptoms. Pt states that the symptoms are exacerbated after meals. Pt reports periodic, long-term cocaine use, last used 5 days ago. Pt is a smoker. No alleviating factors noted. Pt denies other associated symptoms.   Past Medical History:  Diagnosis Date  . Hypertension     Patient Active Problem List   Diagnosis Date Noted  . 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 Acute kidney injury (HCC) 05/06/2016  . Tobacco abuse 05/06/2016  . Cocaine abuse 05/06/2016  . LVH (left ventricular hypertrophy) 05/06/2016  . Demand ischemia of myocardium (HCC) 05/06/2016  . Elevated troponin I level 05/06/2016  . Hyperlipidemia 05/06/2016  . Chronic left shoulder  pain 05/06/2016  . Chest pain 05/05/2016    Past Surgical History:  Procedure Laterality Date  . NECK SURGERY         Home Medications    Prior to Admission medications   Medication Sig Start Date End Date Taking? Authorizing Provider  amLODipine (NORVASC) 10 MG tablet Take 1 tablet (10 mg total) by mouth daily. 05/06/16   Nishant Dhungel, MD  aspirin (ASPIRIN CHILDRENS) 81 MG chewable tablet Chew 1 tablet (81 mg total) by mouth daily. 05/06/16   Nishant Dhungel, MD  atorvastatin (LIPITOR) 20 MG tablet Take 1 tablet (20 mg total) by mouth daily. 05/06/16   Nishant Dhungel, MD  cloNIDine (CATAPRES) 0.3 MG tablet Take 1 tablet (0.3 mg total) by mouth 3 (three) times daily. 05/07/16   Nishant Dhungel, MD  hydrALAZINE (APRESOLINE) 100 MG tablet Take 1 tablet (100 mg total) by mouth every 8 (eight) hours. 05/06/16   Eddie NorthNishant Dhungel, MD    Family History Family History  Problem Relation Age of Onset  . Hypertension Mother   . Hypertension Father   . Diabetes Father     Social History Social History  Substance Use Topics  . Smoking status: Current Every Day Smoker    Types: Cigarettes  . Smokeless tobacco: Never Used  . Alcohol use Yes     Comment: social     Allergies   Review of patient's allergies indicates no known allergies.   Review of Systems Review of Systems  Respiratory: Positive for shortness of breath.   Cardiovascular:  Positive for chest pain.     Physical Exam Updated Vital Signs BP 178/96   Pulse 81   Temp 97.6 F (36.4 C) (Oral)   Resp 18   Ht 6' (1.829 m)   Wt 275 lb (124.7 kg)   SpO2 98%   BMI 37.30 kg/m   Physical Exam  Constitutional: He is oriented to person, place, and time. He appears well-developed and well-nourished. No distress.  HENT:  Head: Normocephalic and atraumatic.  Mouth/Throat: Oropharynx is clear and moist. No oropharyngeal exudate.  Eyes: Conjunctivae and EOM are normal. Pupils are equal, round, and reactive to light.    Neck: Normal range of motion. Neck supple.  No meningismus.  Cardiovascular: Normal rate, regular rhythm, normal heart sounds and intact distal pulses.   No murmur heard. Pulmonary/Chest: Effort normal and breath sounds normal. No respiratory distress.  Abdominal: Soft. There is no tenderness. There is no rebound and no guarding.  Musculoskeletal: Normal range of motion. He exhibits no edema or tenderness.  Neurological: He is alert and oriented to person, place, and time. No cranial nerve deficit. He exhibits normal muscle tone. Coordination normal.  No ataxia on finger to nose bilaterally. No pronator drift. 5/5 strength throughout. CN 2-12 intact.Equal grip strength. Sensation intact.   Skin: Skin is warm.  Psychiatric: He has a normal mood and affect. His behavior is normal.  Nursing note and vitals reviewed.    ED Treatments / Results  DIAGNOSTIC STUDIES:  Oxygen Saturation is 98% on RA, normal by my interpretation.    COORDINATION OF CARE:  10:25 PM Discussed treatment plan with pt at bedside and pt agreed to plan.   Labs (all labs ordered are listed, but only abnormal results are displayed) Labs Reviewed - No data to display  EKG  EKG Interpretation  Date/Time:  Monday May 10 2016 21:14:03 EDT Ventricular Rate:  83 PR Interval:  152 QRS Duration: 92 QT Interval:  372 QTC Calculation: 437 R Axis:   -7 Text Interpretation:  Normal sinus rhythm Possible Anterior infarct , age undetermined T wave abnormality, consider inferolateral ischemia Abnormal ECG Confirmed by Louis Cribb  MD, Louis Yang ([id number]) on 05/10/2016 11:54:13 PM       Radiology No results found.  Procedures Procedures (including critical care time)  Medications Ordered in ED Medications - No data to display   Initial Impression / Assessment and Plan / ED Course  I have reviewed the triage vital signs and the nursing notes.  Pertinent labs & imaging results that were available during my care of the  patient were reviewed by me and considered in my medical decision making (see chart for details).  Clinical Course    Patient presents here with complaints of chest discomfort. He was recently admitted with similar complaints and elevated troponin. He had a workup to look more to hypertension and the possibility of coronary artery disease. He reports intermittent chest pain since leaving the hospital. This became worse this evening and presents for evaluation of it. His EKG is unchanged from prior studies and today's troponin is 0.11. It was 0.13 at his last admission. The patient is very anxious about what is happening and feels as though he needs more tests to determine the etiology. His follow-up appointment with cardiology is not until the end of the month. I've spoken with Dr. Madelyn Flavors from the hospitalist service who agrees to accept the patient in transfer.  Final Clinical Impressions(s) / ED Diagnoses   Final diagnoses:  None  New Prescriptions New Prescriptions   No medications on file  I personally performed the services described in this documentation, which was scribed in my presence. The recorded information has been reviewed and is accurate.        Louis Lyons, MD 05/10/16 [phone]

## 2016-05-10 NOTE — Progress Notes (Signed)
Received information regarding patient from Dr. Judd Lienelo.  Mr. Louis Yang is a 50 year old male with past medical history of HTN and cocaine abuse; who presents with complaints of chest pain intermittently since [facility]. During that hospitalization he was evaluated 2-D echo at that time which showed EF of 55-60%, grade 1 diastolic dysfunction, and a mildly dilated ascending aorta. Patient's troponin elevation on admission to 0.11( previously 0.13 during last hospitalization). EKG showing LVH with repolarization abnormalitiesnoted again ST wave elevations. ED physician to check UDS and give nitroglycerin to try and relieve symptoms of pain prior to transport. Patient to go to a telemetry bed possibly versus stepdown if chest pain not resolve. Will possibly need cardiology evaluation in a.m.

## 2016-05-11 ENCOUNTER — Observation Stay (HOSPITAL_COMMUNITY): Payer: Medicaid - Out of State

## 2016-05-11 DIAGNOSIS — F1410 Cocaine abuse, uncomplicated: Secondary | ICD-10-CM

## 2016-05-11 DIAGNOSIS — F17210 Nicotine dependence, cigarettes, uncomplicated: Secondary | ICD-10-CM | POA: Diagnosis present

## 2016-05-11 DIAGNOSIS — E785 Hyperlipidemia, unspecified: Secondary | ICD-10-CM | POA: Diagnosis present

## 2016-05-11 DIAGNOSIS — R778 Other specified abnormalities of plasma proteins: Secondary | ICD-10-CM | POA: Diagnosis present

## 2016-05-11 DIAGNOSIS — I129 Hypertensive chronic kidney disease with stage 1 through stage 4 chronic kidney disease, or unspecified chronic kidney disease: Secondary | ICD-10-CM | POA: Diagnosis present

## 2016-05-11 DIAGNOSIS — R079 Chest pain, unspecified: Secondary | ICD-10-CM | POA: Diagnosis not present

## 2016-05-11 DIAGNOSIS — E784 Other hyperlipidemia: Secondary | ICD-10-CM

## 2016-05-11 DIAGNOSIS — R0789 Other chest pain: Secondary | ICD-10-CM | POA: Diagnosis not present

## 2016-05-11 DIAGNOSIS — Z720 Tobacco use: Secondary | ICD-10-CM

## 2016-05-11 DIAGNOSIS — F14929 Cocaine use, unspecified with intoxication, unspecified: Secondary | ICD-10-CM | POA: Diagnosis present

## 2016-05-11 DIAGNOSIS — R748 Abnormal levels of other serum enzymes: Secondary | ICD-10-CM

## 2016-05-11 DIAGNOSIS — N183 Chronic kidney disease, stage 3 (moderate): Secondary | ICD-10-CM | POA: Diagnosis present

## 2016-05-11 DIAGNOSIS — I10 Essential (primary) hypertension: Secondary | ICD-10-CM | POA: Diagnosis present

## 2016-05-11 DIAGNOSIS — Z79899 Other long term (current) drug therapy: Secondary | ICD-10-CM | POA: Diagnosis not present

## 2016-05-11 LAB — CBC
HCT: 40 % (ref 39.0–52.0)
HEMOGLOBIN: 13 g/dL (ref 13.0–17.0)
MCH: 28 pg (ref 26.0–34.0)
MCHC: 32.5 g/dL (ref 30.0–36.0)
MCV: 86.2 fL (ref 78.0–100.0)
PLATELETS: 302 10*3/uL (ref 150–400)
RBC: 4.64 MIL/uL (ref 4.22–5.81)
RDW: 16.8 % — AB (ref 11.5–15.5)
WBC: 7.7 10*3/uL (ref 4.0–10.5)

## 2016-05-11 LAB — BASIC METABOLIC PANEL
Anion gap: 9 (ref 5–15)
BUN: 21 mg/dL — AB (ref 6–20)
CALCIUM: 9.2 mg/dL (ref 8.9–10.3)
CHLORIDE: 107 mmol/L (ref 101–111)
CO2: 23 mmol/L (ref 22–32)
CREATININE: 2.08 mg/dL — AB (ref 0.61–1.24)
GFR calc Af Amer: 41 mL/min — ABNORMAL LOW (ref >60)
GFR calc non Af Amer: 35 mL/min — ABNORMAL LOW (ref >60)
Glucose, Bld: 122 mg/dL — ABNORMAL HIGH (ref 65–99)
Potassium: 3.7 mmol/L (ref 3.5–5.1)
SODIUM: 139 mmol/L (ref 135–145)

## 2016-05-11 LAB — RAPID URINE DRUG SCREEN, HOSP PERFORMED
Amphetamines: NOT DETECTED
BARBITURATES: NOT DETECTED
Benzodiazepines: NOT DETECTED
Cocaine: POSITIVE — AB
Opiates: NOT DETECTED
TETRAHYDROCANNABINOL: NOT DETECTED

## 2016-05-11 LAB — TROPONIN I
TROPONIN I: 0.08 ng/mL — AB (ref <0.03)
TROPONIN I: 0.09 ng/mL — AB (ref <0.03)
TROPONIN I: 0.08 ng/mL — AB (ref <0.03)

## 2016-05-11 LAB — CK: CK TOTAL: 148 U/L (ref 49–397)

## 2016-05-11 MED ORDER — GI COCKTAIL ~~LOC~~: 30.0000 mL | Freq: Four times a day (QID) | ORAL | Status: DC | PRN

## 2016-05-11 MED ORDER — HEPARIN SODIUM (PORCINE) 5000 UNIT/ML IJ SOLN
5000.0000 [IU] | Freq: Three times a day (TID) | INTRAMUSCULAR | Status: DC
  Administered 2016-05-11 – 2016-05-13 (×7): 5000 [IU] via SUBCUTANEOUS
  Filled 2016-05-11 (×7): qty 1

## 2016-05-11 MED ORDER — PANTOPRAZOLE SODIUM 40 MG PO TBEC
40.0000 mg | DELAYED_RELEASE_TABLET | Freq: Every day | ORAL | Status: DC
  Administered 2016-05-11 – 2016-05-12 (×2): 40 mg via ORAL
  Filled 2016-05-11 (×3): qty 1

## 2016-05-11 MED ORDER — ACETAMINOPHEN 325 MG PO TABS: 650.0000 mg | ORAL_TABLET | ORAL | Status: DC | PRN

## 2016-05-11 MED ORDER — ONDANSETRON HCL 4 MG/2ML IJ SOLN: 4.0000 mg | Freq: Four times a day (QID) | INTRAMUSCULAR | Status: DC | PRN

## 2016-05-11 MED ORDER — NICOTINE 21 MG/24HR TD PT24
21.0000 mg | MEDICATED_PATCH | Freq: Every day | TRANSDERMAL | Status: DC
  Administered 2016-05-11 – 2016-05-12 (×2): 21 mg via TRANSDERMAL
  Filled 2016-05-11 (×2): qty 1

## 2016-05-11 MED ORDER — HYDRALAZINE HCL 50 MG PO TABS
100.0000 mg | ORAL_TABLET | Freq: Three times a day (TID) | ORAL | Status: DC
  Administered 2016-05-11 – 2016-05-13 (×7): 100 mg via ORAL
  Filled 2016-05-11 (×7): qty 2

## 2016-05-11 MED ORDER — ASPIRIN 81 MG PO CHEW
81.0000 mg | CHEWABLE_TABLET | Freq: Every day | ORAL | Status: DC
  Administered 2016-05-11 – 2016-05-12 (×2): 81 mg via ORAL
  Filled 2016-05-11 (×3): qty 1

## 2016-05-11 MED ORDER — SODIUM CHLORIDE 0.9 % IV SOLN
INTRAVENOUS | Status: DC
  Administered 2016-05-11 – 2016-05-12 (×3): via INTRAVENOUS

## 2016-05-11 MED ORDER — ATORVASTATIN CALCIUM 20 MG PO TABS
20.0000 mg | ORAL_TABLET | Freq: Every day | ORAL | Status: DC
  Administered 2016-05-11 – 2016-05-12 (×2): 20 mg via ORAL
  Filled 2016-05-11 (×2): qty 1

## 2016-05-11 MED ORDER — MORPHINE SULFATE (PF) 2 MG/ML IV SOLN
2.0000 mg | INTRAVENOUS | Status: DC | PRN
  Administered 2016-05-11 – 2016-05-12 (×4): 2 mg via INTRAVENOUS
  Filled 2016-05-11 (×5): qty 1

## 2016-05-11 MED ORDER — AMLODIPINE BESYLATE 10 MG PO TABS
10.0000 mg | ORAL_TABLET | Freq: Every day | ORAL | Status: DC
  Administered 2016-05-11 – 2016-05-12 (×2): 10 mg via ORAL
  Filled 2016-05-11 (×2): qty 1

## 2016-05-11 MED ORDER — CLONIDINE HCL 0.3 MG PO TABS
0.3000 mg | ORAL_TABLET | Freq: Three times a day (TID) | ORAL | Status: DC
  Administered 2016-05-11 – 2016-05-12 (×6): 0.3 mg via ORAL
  Filled 2016-05-11 (×6): qty 1

## 2016-05-11 MED ORDER — IPRATROPIUM-ALBUTEROL 0.5-2.5 (3) MG/3ML IN SOLN: 3.0000 mL | RESPIRATORY_TRACT | Status: DC | PRN

## 2016-05-11 NOTE — Progress Notes (Signed)
Patient admitted earlier today by Dr. Oleh Geninandel Smith. Please see H&P for details.  50 year old male with a history of hypertension, chronic kidney disease, tobacco and cocaine abuse, presented for chest pain. Patient was recently emitted and discharged on 05/06/2014 for malignant hypertensive urgency and found to have cocaine induced chest pain at that time. He states his last use was approximately 1 week ago. Patient does have mildly elevated troponin at 0.11 however this is lower than previous admission, 0.13. Last echocardiogram was 05/05/2016 showed an EF 55-60%, grade 1 diastolic dysfunction. Suspect chest pain may be secondary to uncontrolled blood pressure. We'll continue to trend cardiac troponins. If troponin peaks, will consult cardiology.  Time spent: 30 minutes  Rowen Wilmer D.O. [facility] Pager [phone]  If 7PM-7AM, please contact night-coverage [email or web address] Password [email or web address] 05/11/2016, 2:16 PM

## 2016-05-11 NOTE — H&P (Signed)
History and Physical    Louis BlanksRoscoe Yang ZOX:[id number] DOB: 11/22/1965 DOA: 05/10/2016  Referring MD/NP/PA: Dr. Judd Lienelo PCP: No primary care provider on file.  [facility]  Chief Complaint:  Chest pain  HPI: Louis BlanksRoscoe Vicario is a 50 y.o. male with medical history significant of  HTN, CKD, tobacco and cocaine abuse; who presents with complaints of chest pain. Symptoms occur intermittently on the right side of his chest. He reports symptoms radiating down his right arm. Associated symptoms of right hand numbness, shortness of breath, left side lid twitching, abdominal discomfort, bloating, and flatulence. Patient reports that he last used cocaine approximately 5 days ago. He continues to smoke cigarettes.     ED Course: Upon admission into the emergency department patient was found to be afebrile, blood pressure as high as 178/96, respirations 18-25, and all other vitals within normal limits. Lab work revealed troponin 0.11, BUN 26, creatinine 2.16, and all other labs were grossly within normal limits. UDS was positive for cocaine.  Review of Systems: As per HPI otherwise 10 point review of systems negative.   Past Medical History:  Diagnosis Date  . Hypertension     Past Surgical History:  Procedure Laterality Date  . NECK SURGERY       reports that he has been smoking Cigarettes.  He has never used smokeless tobacco. He reports that he drinks alcohol. He reports that he uses drugs, including Cocaine.  No Known Allergies  Family History  Problem Relation Age of Onset  . Hypertension Mother   . Hypertension Father   . Diabetes Father     Prior to Admission medications   Medication Sig Start Date End Date Taking? Authorizing Provider  amLODipine (NORVASC) 10 MG tablet Take 1 tablet (10 mg total) by mouth daily. 05/06/16   Nishant Dhungel, MD  aspirin (ASPIRIN CHILDRENS) 81 MG chewable tablet Chew 1 tablet (81 mg total) by mouth daily. 05/06/16   Nishant  Dhungel, MD  atorvastatin (LIPITOR) 20 MG tablet Take 1 tablet (20 mg total) by mouth daily. 05/06/16   Nishant Dhungel, MD  cloNIDine (CATAPRES) 0.3 MG tablet Take 1 tablet (0.3 mg total) by mouth 3 (three) times daily. 05/07/16   Nishant Dhungel, MD  hydrALAZINE (APRESOLINE) 100 MG tablet Take 1 tablet (100 mg total) by mouth every 8 (eight) hours. 05/06/16   Nishant Dhungel, MD    Physical Exam:  Constitutional: Elderly male in some mild distress, but able to follow commands. Vitals:   05/10/16 2110 05/10/16 2300 05/11/16 0023 05/11/16 0140  BP: 178/96 (!) 151/102 146/92 (!) 168/95  Pulse: 81 72 83 79  Resp: 18 25 18 18   Temp: 97.6 F (36.4 C)   97.8 F (36.6 C)  TempSrc: Oral   Oral  SpO2: 98% 100% 100% 98%  Weight:    126.2 kg (278 lb 3.2 oz)  Height:    6' (1.829 m)   Eyes: PERRL, lids normal. Bilateral conjunctival injection ENMT: Mucous membranes are moist. Posterior pharynx clear of any exudate or lesions.Normal dentition.  Neck: normal, supple, no masses, no thyromegaly Respiratory: clear to auscultation bilaterally, no wheezing, no crackles. Normal respiratory effort. No accessory muscle use.  Cardiovascular: Regular rate and rhythm, no murmurs / rubs / gallops. No extremity edema. 2+ pedal pulses. No carotid bruits.  Abdomen: no tenderness, no masses palpated. No hepatosplenomegaly. Bowel sounds positive.  Musculoskeletal: no clubbing / cyanosis. No joint deformity upper and lower extremities. Good ROM, no contractures. Normal muscle tone.  Skin: no rashes, lesions, ulcers. No induration Neurologic: CN 2-12 grossly intact. Sensation intact, DTR normal. Strength 5/5 in all 4.  Psychiatric: Normal judgment and insight. Alert and oriented x 3. Normal mood.     Labs on Admission: I have personally reviewed following labs and imaging studies  CBC:  Recent Labs Lab 05/05/16 0830 05/10/16 2243  WBC 6.7 7.3  NEUTROABS  --  4.0  HGB 14.2 12.4*  HCT 41.5 36.0*  MCV 83.7  84.7  PLT 297 296   Basic Metabolic Panel:  Recent Labs Lab 05/05/16 0830 05/06/16 1055 05/07/16 0822 05/10/16 2243  NA 139 141 141 139  K 4.3 3.9 3.7 3.6  CL 107 107 109 107  CO2 24 29 25 22   GLUCOSE 94 84 131* 87  BUN 21* 28* 25* 26*  CREATININE 2.29* 2.27* 2.10* 2.16*  CALCIUM 9.6 9.6 9.5 9.3   GFR: Estimated Creatinine Clearance: 56.1 mL/min (by C-G formula based on SCr of 2.16 mg/dL (H)). Liver Function Tests:  Recent Labs Lab 05/10/16 2243  AST 20  ALT 15*  ALKPHOS 63  BILITOT 0.4  PROT 6.9  ALBUMIN 4.0   No results for input(s): LIPASE, AMYLASE in the last 168 hours. No results for input(s): AMMONIA in the last 168 hours. Coagulation Profile: No results for input(s): INR, PROTIME in the last 168 hours. Cardiac Enzymes:  Recent Labs Lab 05/05/16 1315 05/05/16 1847 05/06/16 0059 05/06/16 1055 05/10/16 2243  CKTOTAL  --   --   --  115  --   TROPONINI 0.13* 0.13* 0.13*  --  0.11*   BNP (last 3 results) No results for input(s): PROBNP in the last 8760 hours. HbA1C: No results for input(s): HGBA1C in the last 72 hours. CBG: No results for input(s): GLUCAP in the last 168 hours. Lipid Profile: No results for input(s): CHOL, HDL, LDLCALC, TRIG, CHOLHDL, LDLDIRECT in the last 72 hours. Thyroid Function Tests: No results for input(s): TSH, T4TOTAL, FREET4, T3FREE, THYROIDAB in the last 72 hours. Anemia Panel: No results for input(s): VITAMINB12, FOLATE, FERRITIN, TIBC, IRON, RETICCTPCT in the last 72 hours. Urine analysis:    Component Value Date/Time   COLORURINE YELLOW 05/06/2016 1349   APPEARANCEUR CLEAR 05/06/2016 1349   LABSPEC 1.020 05/06/2016 1349   PHURINE 6.0 05/06/2016 1349   GLUCOSEU NEGATIVE 05/06/2016 1349   HGBUR NEGATIVE 05/06/2016 1349   BILIRUBINUR NEGATIVE 05/06/2016 1349   KETONESUR NEGATIVE 05/06/2016 1349   PROTEINUR 30 (A) 05/06/2016 1349   NITRITE NEGATIVE 05/06/2016 1349   LEUKOCYTESUR NEGATIVE 05/06/2016 1349   Sepsis  Labs: No results found for this or any previous visit (from the past 240 hour(s)).   Radiological Exams on Admission: No results found.  EKG: Independently reviewed. Sinus LVH with and a repolarization abnormality  Assessment/Plan Chest pain/Elevated troponin: Acute. Patient reports continued chest pain just recently discharged from the hospital .  Troponin previously elevated 0.13, and now 0.11 on admission. Heart score 5. Last echocardiogram performed on 05/05/2016 showed EF of 55 - 60%, grade 1 diastolic dysfunction, and mildly dilated ascending aorta. - Admit to telemetry bed - Trend cardiac troponins - Recheck EKG in a.m. - Continue aspirin - May warrant formal cardiology consult in a.m.  Cocaine abuse: Patient positive UDS for cocaine use. Question if patient's kidney function is cause for prolonged positive result. - Counseled patient on the need of cessation of cocaine use - Check CPK - Question if abdominal discomfort secondary to withdrawals / use - Check abdominal x-ray  Shortness of breath - Check chest x-ray - DuoNeb's prn  Suspect chronic kidney disease vs. acute renal failure: Suspect a previously to be secondary to hypertensive neuropathy with mild proteinuria noted during previous hospitalization. - IV fluids NS at 100 ml/hr - Continue to monitor  Tobacco abuse - Counseled on the need of cessation of tobacco use - Nicotine patch offered  Essential hypertension - Continue amlodipine, clonidine, and hydralazine  Hyperlipidemia - Continue atorvastatin  GI prophylaxis: Protonix DVT prophylaxis:  heparin  Code Status: Full  Family Communication: Discussed overall care with the patient  Disposition Plan: Likely discharge home in medically stable Consults called: None Admission status: Observation telemetry   Clydie Braun MD [facility] Pager [phone]  If 7PM-7AM, please contact night-coverage [email or web address] Password [email or web address]  05/11/2016,  2:47 AM

## 2016-05-11 NOTE — ED Notes (Signed)
[facility] at Bedside - Attempted report to the floor

## 2016-05-12 ENCOUNTER — Encounter: Payer: Self-pay | Admitting: Nurse Practitioner

## 2016-05-12 DIAGNOSIS — R0789 Other chest pain: Principal | ICD-10-CM

## 2016-05-12 LAB — OCCULT BLOOD X 1 CARD TO LAB, STOOL: FECAL OCCULT BLD: NEGATIVE

## 2016-05-12 LAB — BASIC METABOLIC PANEL
ANION GAP: 6 (ref 5–15)
BUN: 20 mg/dL (ref 6–20)
CALCIUM: 9.1 mg/dL (ref 8.9–10.3)
CHLORIDE: 107 mmol/L (ref 101–111)
CO2: 28 mmol/L (ref 22–32)
CREATININE: 1.9 mg/dL — AB (ref 0.61–1.24)
GFR calc non Af Amer: 40 mL/min — ABNORMAL LOW (ref >60)
GFR, EST AFRICAN AMERICAN: 46 mL/min — AB (ref >60)
Glucose, Bld: 106 mg/dL — ABNORMAL HIGH (ref 65–99)
Potassium: 4.2 mmol/L (ref 3.5–5.1)
SODIUM: 141 mmol/L (ref 135–145)

## 2016-05-12 LAB — CBC
HEMATOCRIT: 40.5 % (ref 39.0–52.0)
HEMOGLOBIN: 13.1 g/dL (ref 13.0–17.0)
MCH: 27.6 pg (ref 26.0–34.0)
MCHC: 32.3 g/dL (ref 30.0–36.0)
MCV: 85.3 fL (ref 78.0–100.0)
Platelets: 327 10*3/uL (ref 150–400)
RBC: 4.75 MIL/uL (ref 4.22–5.81)
RDW: 16.8 % — ABNORMAL HIGH (ref 11.5–15.5)
WBC: 6.7 10*3/uL (ref 4.0–10.5)

## 2016-05-12 NOTE — Progress Notes (Signed)
PROGRESS NOTE    Louis Yang  ZOX:[id number] DOB: [birth year] DOA: 05/10/2016 PCP: No primary care provider on file.   Brief Narrative:  50 year old male who presents with a chief complaint of chest pain. His chest pain was not exertion related, it was associated with right hand numbness, dyspnea and abdominal discomfort. He is a urine drug screen was positive for cocaine. His troponin was elevated. He does have chronic kidney disease.   Assessment & Plan:   Principal Problem:   Chest pain Active Problems:   Tobacco abuse   Cocaine abuse   Elevated troponin I level   Hyperlipidemia   Essential hypertension  1. Atypical chest pain. Most likely related to cocaine use. Will continue to monitor cardiac markers, patient with persistent pain, continue telemetry monitoring. Patient with not reported angina or chest pain on exertion, discussed with cardiology over the phone, plan to follow as outpatient. Continue aspirin. Troponin trending down.  2. Cocaine use/ intoxication. Advised to stop, explain possible consequences for heart and internal organs. Will continue blood pressure control and telemetry monitoring.  3. CKD. Base cr 2,2, calculated GFR 39 cw CKD stage 3. K at 4.2 and Na at 141. Will continue to follow renal panel in am, avoid hypotension or nephrotoxic medications.   4. HTN.  Will continue blood pressure control with amlodipine, clonidine and hydralazine. Clinically euvolemic. Blood pressure systolic at 130 to 140 systolic.  5. Dyslipidemia. Will continue statin therapy.  6. Tobacco abuse. Smoking cessation.     DVT prophylaxis: heparin sq Code Status: full  Family Communication: Patient's significant other at the bedside, all questions were addressed. Disposition Plan: home    Consultants:   Cardiology  Procedures:    Antimicrobials:     Subjective: Patient with persistent chest pain, no nausea or vomiting.   Objective: Vitals:   05/12/16 0000 05/12/16  0310 05/12/16 0554 05/12/16 0900  BP: (!) 136/97 (!) 152/99 (!) 149/92 (!) 141/93  Pulse: 64 66    Resp: 18 20    Temp: 98 F (36.7 C) 97.9 F (36.6 C)    TempSrc: Oral Oral    SpO2: 97% 97%    Weight:  128.1 kg (282 lb 6.4 oz)    Height:        Intake/Output Summary (Last 24 hours) at 05/12/16 1214 Last data filed at 05/12/16 0921  Gross per 24 hour  Intake              795 ml  Output              700 ml  Net               95 ml   Filed Weights   05/10/16 2109 05/11/16 0140 05/12/16 0310  Weight: 124.7 kg (275 lb) 126.2 kg (278 lb 3.2 oz) 128.1 kg (282 lb 6.4 oz)    Examination:  General exam: Not in pain or dyspnea E ENT: no pallor or icterus. Oral mucosa moist.  Respiratory system: Clear to auscultation. Respiratory effort normal. Cardiovascular system: S1 & S2 heard, RRR. No JVD, murmurs, rubs, gallops or clicks. No pedal edema. Gastrointestinal system: Abdomen is nondistended, soft and nontender. No organomegaly or masses felt. Normal bowel sounds heard. Central nervous system: Alert and oriented. No focal neurological deficits. Extremities: Symmetric 5 x 5 power. Skin: No rashes, lesions or ulcers    Data Reviewed: I have personally reviewed following labs and imaging studies  CBC:  Recent Labs Lab 05/10/16 2243 05/11/16  0256 05/12/16 0314  WBC 7.3 7.7 6.7  NEUTROABS 4.0  --   --   HGB 12.4* 13.0 13.1  HCT 36.0* 40.0 40.5  MCV 84.7 86.2 85.3  PLT 296 302 327   Basic Metabolic Panel:  Recent Labs Lab 05/06/16 1055 05/07/16 0822 05/10/16 2243 05/11/16 0256 05/12/16 0314  NA 141 141 139 139 141  K 3.9 3.7 3.6 3.7 4.2  CL 107 109 107 107 107  CO2 29 25 22 23 28   GLUCOSE 84 131* 87 122* 106*  BUN 28* 25* 26* 21* 20  CREATININE 2.27* 2.10* 2.16* 2.08* 1.90*  CALCIUM 9.6 9.5 9.3 9.2 9.1   GFR: Estimated Creatinine Clearance: 64.3 mL/min (by C-G formula based on SCr of 1.9 mg/dL (H)). Liver Function Tests:  Recent Labs Lab 05/10/16 2243    AST 20  ALT 15*  ALKPHOS 63  BILITOT 0.4  PROT 6.9  ALBUMIN 4.0   No results for input(s): LIPASE, AMYLASE in the last 168 hours. No results for input(s): AMMONIA in the last 168 hours. Coagulation Profile: No results for input(s): INR, PROTIME in the last 168 hours. Cardiac Enzymes:  Recent Labs Lab 05/06/16 0059 05/06/16 1055 05/10/16 2243 05/11/16 0256 05/11/16 0546 05/11/16 0902  CKTOTAL  --  115  --  148  --   --   TROPONINI 0.13*  --  0.11* 0.08* 0.09* 0.08*   BNP (last 3 results) No results for input(s): PROBNP in the last 8760 hours. HbA1C: No results for input(s): HGBA1C in the last 72 hours. CBG: No results for input(s): GLUCAP in the last 168 hours. Lipid Profile: No results for input(s): CHOL, HDL, LDLCALC, TRIG, CHOLHDL, LDLDIRECT in the last 72 hours. Thyroid Function Tests: No results for input(s): TSH, T4TOTAL, FREET4, T3FREE, THYROIDAB in the last 72 hours. Anemia Panel: No results for input(s): VITAMINB12, FOLATE, FERRITIN, TIBC, IRON, RETICCTPCT in the last 72 hours. Sepsis Labs: No results for input(s): PROCALCITON, LATICACIDVEN in the last 168 hours.  No results found for this or any previous visit (from the past 240 hour(s)).       Radiology Studies: Dg Chest 1 View  Result Date: 05/11/2016 CLINICAL DATA:  50 year old male with shortness of breath and chest discomfort. Initial encounter. EXAM: CHEST 1 VIEW COMPARISON:  05/05/2016 chest radiographs. FINDINGS: PA view at 0930 hours. Stable cardiac size at the upper limits of normal to mildly enlarged. Stable mild tortuosity of the aorta. Other mediastinal contours are within normal limits. Visualized tracheal air column is within normal limits. Small calcified granulomas suspected in the left upper lobe. Otherwise the lungs are clear. No pneumothorax or effusion. No pneumoperitoneum. Negative visible bowel gas in the upper abdomen. IMPRESSION: Negative chest aside from borderline to mild  cardiomegaly. Electronically Signed   By: Odessa Fleming M.D.   On: 05/11/2016 09:50   Dg Abd 1 View  Result Date: 05/11/2016 CLINICAL DATA:  50 year old male with abdominal distention and pain after eating. Constipation. Initial encounter. EXAM: ABDOMEN - 1 VIEW COMPARISON:  None. FINDINGS: The upper abdomen and diaphragm are not included. Visualized bowel gas pattern is non obstructed. No significant retained stool. Visible abdominal and pelvic visceral contours are normal. Small right hemipelvis phleboliths. No acute osseous abnormality identified. IMPRESSION: Normal visible bowel gas pattern. Negative KUB appearance of the abdomen and pelvis. Electronically Signed   By: Odessa Fleming M.D.   On: 05/11/2016 09:49        Scheduled Meds: . amLODipine  10 mg Oral Daily  .  aspirin  81 mg Oral Daily  . atorvastatin  20 mg Oral q1800  . cloNIDine  0.3 mg Oral TID  . heparin  5,000 Units Subcutaneous Q8H  . hydrALAZINE  100 mg Oral Q8H  . nicotine  21 mg Transdermal Daily  . pantoprazole  40 mg Oral Daily   Continuous Infusions: . sodium chloride 100 mL/hr at 05/12/16 0303     LOS: 1 day       Coralie KeensMauricio Daniel Johnie Stadel, MD [facility] Pager [phone]  If 7PM-7AM, please contact night-coverage [email or web address] Password [email or web address] 05/12/2016, 12:14 PM

## 2016-05-12 NOTE — Care Management Note (Signed)
Case Management Note  Patient Details  Name: Louis Yang MRN: [id number] Date of Birth: 01/26/1966  Subjective/Objective:    Admitted to Observation for chest pain                Action/Plan: Patient lives with his girlfriend; has Medicaid of [place]. Patient is not sure if he will make [place] his home vs returning to [place]; he stated that from his previous admission, he has bee set up to [facility]. Independent of his ADL, no DME needed.  CM will continue to follow for DCP.  Expected Discharge Date:   possibly 05/13/2016               Expected Discharge Plan:  Home/Self Care  Discharge planning Services  CM Consult  Status of Service:  In process, will continue to follow  Reola MosherChandler, [name], RN,MHA,BSN [phone] 05/12/2016, 11:18 AM

## 2016-05-13 LAB — BASIC METABOLIC PANEL
ANION GAP: 6 (ref 5–15)
BUN: 18 mg/dL (ref 6–20)
CALCIUM: 9.1 mg/dL (ref 8.9–10.3)
CO2: 26 mmol/L (ref 22–32)
Chloride: 107 mmol/L (ref 101–111)
Creatinine, Ser: 1.88 mg/dL — ABNORMAL HIGH (ref 0.61–1.24)
GFR, EST AFRICAN AMERICAN: 46 mL/min — AB (ref >60)
GFR, EST NON AFRICAN AMERICAN: 40 mL/min — AB (ref >60)
GLUCOSE: 106 mg/dL — AB (ref 65–99)
POTASSIUM: 4 mmol/L (ref 3.5–5.1)
Sodium: 139 mmol/L (ref 135–145)

## 2016-05-13 LAB — TROPONIN I: TROPONIN I: 0.05 ng/mL — AB (ref <0.03)

## 2016-05-13 NOTE — Progress Notes (Signed)
Pt left AMA. He said, he did not want to wait on the MD to be called and he would deal with his health later. AMA papers given to patient to sign. He signed the papers and left the room ambulating w/ his friend.

## 2016-05-13 NOTE — Discharge Summary (Signed)
Physician Discharge Summary  Louis Yang ZOX:[id number] DOB: 02/26/1966 DOA: 05/10/2016  PCP: No primary care provider on file.  Admit date: 05/10/2016 Discharge date: 05/13/2016  Admitted From: Home Disposition:  Home   Recommendations for Outpatient Follow-up:  1. Follow up with PCP in 1- weeks   Patient left the hospital AGAINST MEDICAL ADVICE  Home Health: No  Equipment/Devices: No   Discharge Condition Stable CODE STATUS: full  Diet recommendation: Heart Healthy   Brief/Interim Summary: 50 year old male who presents with a chief complaint of chest pain. His chest pain was not exertion related, it was associated with right hand numbness, dyspnea and abdominal discomfort. He is a urine drug screen was positive for cocaine. His troponin was elevated. He does have chronic kidney disease.   1. Atypical chest pain. Was presumed related to cocaine abuse. No signs of acute coronary syndrome. Patient chest pain improved. He does have a schedule an outpatient follow-up with cardiology. The troponin peaked at 0.08. Her EKG was sinus rhythm with lateral T-wave inversions/chronic.  2. Cocaine use/intoxication. She was strongly reinforced about avoid illegal substances.  3. Chronic kidney disease. Patient kidney function remained stable. He does have chronic kidney disease stage III.  4. Hypertension. Patient had blood pressure control with amlodipine, clonidine and hydralazine.   Discharge Diagnoses:  Principal Problem:   Chest pain Active Problems:   Tobacco abuse   Cocaine abuse   Elevated troponin I level   Hyperlipidemia   Essential hypertension    Discharge Instructions     Medication List    ASK your doctor about these medications   amLODipine 10 MG tablet Commonly known as:  NORVASC Take 1 tablet (10 mg total) by mouth daily.   aspirin 81 MG chewable tablet Commonly known as:  ASPIRIN CHILDRENS Chew 1 tablet (81 mg total) by mouth daily.   atorvastatin 20  MG tablet Commonly known as:  LIPITOR Take 1 tablet (20 mg total) by mouth daily.   cloNIDine 0.3 MG tablet Commonly known as:  CATAPRES Take 1 tablet (0.3 mg total) by mouth 3 (three) times daily.   hydrALAZINE 100 MG tablet Commonly known as:  APRESOLINE Take 1 tablet (100 mg total) by mouth every 8 (eight) hours.      Follow-up Information    [facility] EMERGENCY DEPARTMENT.   Specialty:  Emergency Medicine Contact information: [address] [id number] [address] [place] [id number] [phone]         No Known Allergies  Consultations:     Procedures/Studies: Dg Chest 1 View  Result Date: 05/11/2016 CLINICAL DATA:  50 year old male with shortness of breath and chest discomfort. Initial encounter. EXAM: CHEST 1 VIEW COMPARISON:  05/05/2016 chest radiographs. FINDINGS: PA view at 0930 hours. Stable cardiac size at the upper limits of normal to mildly enlarged. Stable mild tortuosity of the aorta. Other mediastinal contours are within normal limits. Visualized tracheal air column is within normal limits. Small calcified granulomas suspected in the left upper lobe. Otherwise the lungs are clear. No pneumothorax or effusion. No pneumoperitoneum. Negative visible bowel gas in the upper abdomen. IMPRESSION: Negative chest aside from borderline to mild cardiomegaly. Electronically Signed   By: Odessa FlemingH  Hall M.D.   On: 05/11/2016 09:50   Dg Chest 2 View  Result Date: 05/05/2016 CLINICAL DATA:  Acute mid to left chest pain with cough EXAM: CHEST  2 VIEW COMPARISON:  None available FINDINGS: Mild cardiac enlargement without focal pneumonia, edema, CHF, effusion or pneumothorax. Trachea is midline.  Aorta is elongated and tortuous. No acute osseous finding. Minor degenerative spondylosis of the spine. IMPRESSION: Mild cardiomegaly without acute chest process Electronically Signed   By: Judie Petit.  Shick M.D.   On: 05/05/2016 09:04   Dg Abd 1 View  Result Date:  05/11/2016 CLINICAL DATA:  50 year old male with abdominal distention and pain after eating. Constipation. Initial encounter. EXAM: ABDOMEN - 1 VIEW COMPARISON:  None. FINDINGS: The upper abdomen and diaphragm are not included. Visualized bowel gas pattern is non obstructed. No significant retained stool. Visible abdominal and pelvic visceral contours are normal. Small right hemipelvis phleboliths. No acute osseous abnormality identified. IMPRESSION: Normal visible bowel gas pattern. Negative KUB appearance of the abdomen and pelvis. Electronically Signed   By: Odessa Fleming M.D.   On: 05/11/2016 09:49   Dg Shoulder Left  Result Date: 05/05/2016 CLINICAL DATA:  Progressive pain and decreased range of motion EXAM: LEFT SHOULDER - 2+ VIEW COMPARISON:  January 18, 2016 FINDINGS: Frontal, Y scapular, and axillary images were obtained. There is no evident acute fracture or dislocation. A Hill-Sachs defect is noted in the lateral humeral head region, best seen on the axillary image, stable. 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 The glenohumeral joint appears unremarkable. Visualized left lung is clear. 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 Hill-Sachs defect consistent with prior anterior dislocation. No acute fracture or dislocation. Electronically Signed   By: Bretta Bang III M.D.   On: 05/05/2016 10:39       Subjective:  Patient left AGAINST MEDICAL ADVICE Discharge Exam: Vitals:   05/13/16 0043 05/13/16 0500  BP: (!) 145/94 (!) 154/112  Pulse: 70 67  Resp: 18 18  Temp: 98.4 F (36.9 C) 97.9 F (36.6 C)   Vitals:   05/12/16 1216 05/12/16 2004 05/13/16 0043 05/13/16 0500  BP: (!) 133/93 (!) 143/89 (!) 145/94 (!) 154/112  Pulse: 63 (!) 58 70 67  Resp: 20 18 18 18   Temp: 98 F (36.7 C) 98.2 F (36.8 C) 98.4 F (36.9 C) 97.9 F (36.6 C)  TempSrc: Oral Oral Oral Oral   SpO2: 100% 98% 95%   Weight:    128.8 kg (283 lb 14.4 oz)  Height:        Patient left AGAINST MEDICAL ADVICE   The results of significant diagnostics from this hospitalization (including imaging, microbiology, ancillary and laboratory) are listed below for reference.     Microbiology: No results found for this or any previous visit (from the past 240 hour(s)).   Labs: BNP (last 3 results) No results for input(s): BNP in the last 8760 hours. Basic Metabolic Panel:  Recent Labs Lab 05/07/16 0822 05/10/16 2243 05/11/16 0256 05/12/16 0314 05/13/16 0330  NA 141 139 139 141 139  K 3.7 3.6 3.7 4.2 4.0  CL 109 107 107 107 107  CO2 25 22 23 28 26   GLUCOSE 131* 87 122* 106* 106*  BUN 25* 26* 21* 20 18  CREATININE 2.10* 2.16* 2.08* 1.90* 1.88*  CALCIUM 9.5 9.3 9.2 9.1 9.1   Liver Function Tests:  Recent Labs Lab 05/10/16 2243  AST 20  ALT 15*  ALKPHOS 63  BILITOT 0.4  PROT 6.9  ALBUMIN 4.0   No results for input(s): LIPASE, AMYLASE in the last 168 hours. No results for input(s): AMMONIA in the last 168 hours. CBC:  Recent Labs Lab 05/10/16 2243 05/11/16 0256 05/12/16 0314  WBC 7.3 7.7 6.7  NEUTROABS  4.0  --   --   HGB 12.4* 13.0 13.1  HCT 36.0* 40.0 40.5  MCV 84.7 86.2 85.3  PLT 296 302 327   Cardiac Enzymes:  Recent Labs Lab 05/10/16 2243 05/11/16 0256 05/11/16 0546 05/11/16 0902 05/13/16 0330  CKTOTAL  --  148  --   --   --   TROPONINI 0.11* 0.08* 0.09* 0.08* 0.05*   BNP: Invalid input(s): POCBNP CBG: No results for input(s): GLUCAP in the last 168 hours. D-Dimer No results for input(s): DDIMER in the last 72 hours. Hgb A1c No results for input(s): HGBA1C in the last 72 hours. Lipid Profile No results for input(s): CHOL, HDL, LDLCALC, TRIG, CHOLHDL, LDLDIRECT in the last 72 hours. Thyroid function studies No results for input(s): TSH, T4TOTAL, T3FREE, THYROIDAB in the last 72 hours.  Invalid input(s): FREET3 Anemia work up No results  for input(s): VITAMINB12, FOLATE, FERRITIN, TIBC, IRON, RETICCTPCT in the last 72 hours. Urinalysis    Component Value Date/Time   COLORURINE YELLOW 05/06/2016 1349   APPEARANCEUR CLEAR 05/06/2016 1349   LABSPEC 1.020 05/06/2016 1349   PHURINE 6.0 05/06/2016 1349   GLUCOSEU NEGATIVE 05/06/2016 1349   HGBUR NEGATIVE 05/06/2016 1349   BILIRUBINUR NEGATIVE 05/06/2016 1349   KETONESUR NEGATIVE 05/06/2016 1349   PROTEINUR 30 (A) 05/06/2016 1349   NITRITE NEGATIVE 05/06/2016 1349   LEUKOCYTESUR NEGATIVE 05/06/2016 1349   Sepsis Labs Invalid input(s): PROCALCITONIN,  WBC,  LACTICIDVEN Microbiology No results found for this or any previous visit (from the past 240 hour(s)).     SIGNED:   Coralie Keens, MD  [facility] 05/13/2016, 4:48 PM Pager   If 7PM-7AM, please contact night-coverage [email or web address] Password [email or web address]

## 2016-05-25 ENCOUNTER — Ambulatory Visit: Payer: Medicaid - Out of State | Admitting: Family Medicine

## 2016-12-04 ENCOUNTER — Emergency Department (HOSPITAL_COMMUNITY)
Admission: EM | Admit: 2016-12-04 | Discharge: 2016-12-04 | Disposition: A | Discharge status: Home / Self Care | Payer: Medicaid - Out of State | Attending: Emergency Medicine | Admitting: Emergency Medicine

## 2016-12-04 ENCOUNTER — Encounter (HOSPITAL_COMMUNITY): Payer: Self-pay

## 2016-12-04 ENCOUNTER — Emergency Department (HOSPITAL_COMMUNITY): Payer: Medicaid - Out of State

## 2016-12-04 DIAGNOSIS — I10 Essential (primary) hypertension: Secondary | ICD-10-CM

## 2016-12-04 DIAGNOSIS — R109 Unspecified abdominal pain: Secondary | ICD-10-CM

## 2016-12-04 DIAGNOSIS — Z79899 Other long term (current) drug therapy: Secondary | ICD-10-CM | POA: Insufficient documentation

## 2016-12-04 DIAGNOSIS — F17210 Nicotine dependence, cigarettes, uncomplicated: Secondary | ICD-10-CM | POA: Insufficient documentation

## 2016-12-04 DIAGNOSIS — I129 Hypertensive chronic kidney disease with stage 1 through stage 4 chronic kidney disease, or unspecified chronic kidney disease: Secondary | ICD-10-CM | POA: Insufficient documentation

## 2016-12-04 DIAGNOSIS — N189 Chronic kidney disease, unspecified: Secondary | ICD-10-CM

## 2016-12-04 DIAGNOSIS — N182 Chronic kidney disease, stage 2 (mild): Secondary | ICD-10-CM | POA: Insufficient documentation

## 2016-12-04 DIAGNOSIS — R1084 Generalized abdominal pain: Secondary | ICD-10-CM | POA: Insufficient documentation

## 2016-12-04 DIAGNOSIS — Z7982 Long term (current) use of aspirin: Secondary | ICD-10-CM | POA: Insufficient documentation

## 2016-12-04 LAB — URINALYSIS, ROUTINE W REFLEX MICROSCOPIC
Bilirubin Urine: NEGATIVE
GLUCOSE, UA: NEGATIVE mg/dL
HGB URINE DIPSTICK: NEGATIVE
Ketones, ur: NEGATIVE mg/dL
Leukocytes, UA: NEGATIVE
Nitrite: NEGATIVE
PH: 5 (ref 5.0–8.0)
Protein, ur: NEGATIVE mg/dL
SPECIFIC GRAVITY, URINE: 1.006 (ref 1.005–1.030)

## 2016-12-04 LAB — COMPREHENSIVE METABOLIC PANEL
ALBUMIN: 4.6 g/dL (ref 3.5–5.0)
ALK PHOS: 66 U/L (ref 38–126)
ALT: 18 U/L (ref 17–63)
ANION GAP: 7 (ref 5–15)
AST: 30 U/L (ref 15–41)
BILIRUBIN TOTAL: 1.3 mg/dL — AB (ref 0.3–1.2)
BUN: 28 mg/dL — ABNORMAL HIGH (ref 6–20)
CHLORIDE: 101 mmol/L (ref 101–111)
CO2: 28 mmol/L (ref 22–32)
Calcium: 9.8 mg/dL (ref 8.9–10.3)
Creatinine, Ser: 2.63 mg/dL — ABNORMAL HIGH (ref 0.61–1.24)
GFR calc Af Amer: 31 mL/min — ABNORMAL LOW (ref >60)
GFR, EST NON AFRICAN AMERICAN: 27 mL/min — AB (ref >60)
GLUCOSE: 121 mg/dL — AB (ref 65–99)
POTASSIUM: 4.1 mmol/L (ref 3.5–5.1)
Sodium: 136 mmol/L (ref 135–145)
Total Protein: 7.7 g/dL (ref 6.5–8.1)

## 2016-12-04 LAB — CBC
HEMATOCRIT: 42.9 % (ref 39.0–52.0)
HEMOGLOBIN: 15.3 g/dL (ref 13.0–17.0)
MCH: 29.3 pg (ref 26.0–34.0)
MCHC: 35.7 g/dL (ref 30.0–36.0)
MCV: 82.2 fL (ref 78.0–100.0)
Platelets: 345 10*3/uL (ref 150–400)
RBC: 5.22 MIL/uL (ref 4.22–5.81)
RDW: 16.5 % — ABNORMAL HIGH (ref 11.5–15.5)
WBC: 7.7 10*3/uL (ref 4.0–10.5)

## 2016-12-04 LAB — LIPASE, BLOOD: Lipase: 32 U/L (ref 11–51)

## 2016-12-04 MED ORDER — SODIUM CHLORIDE 0.9 % IV BOLUS (SEPSIS)
1000.0000 mL | Freq: Once | INTRAVENOUS | Status: AC
  Administered 2016-12-04: 1000 mL via INTRAVENOUS

## 2016-12-04 MED ORDER — AMLODIPINE BESYLATE 5 MG PO TABS
10.0000 mg | ORAL_TABLET | Freq: Once | ORAL | Status: AC
  Administered 2016-12-04: 10 mg via ORAL
  Filled 2016-12-04: qty 2

## 2016-12-04 MED ORDER — HYDRALAZINE HCL 100 MG PO TABS: 100.0000 mg | ORAL_TABLET | Freq: Three times a day (TID) | ORAL | 0 refills | Status: DC

## 2016-12-04 MED ORDER — AMLODIPINE BESYLATE 10 MG PO TABS: 10.0000 mg | ORAL_TABLET | Freq: Every day | ORAL | 0 refills | Status: DC

## 2016-12-04 MED ORDER — IOPAMIDOL (ISOVUE-300) INJECTION 61%
15.0000 mL | Freq: Once | INTRAVENOUS | Status: AC | PRN
  Administered 2016-12-04: 15 mL via ORAL

## 2016-12-04 MED ORDER — CLONIDINE HCL 0.3 MG PO TABS: 0.3000 mg | ORAL_TABLET | Freq: Three times a day (TID) | ORAL | 0 refills | Status: DC

## 2016-12-04 MED ORDER — ATORVASTATIN CALCIUM 20 MG PO TABS: 20.0000 mg | ORAL_TABLET | Freq: Every day | ORAL | 0 refills | Status: DC

## 2016-12-04 MED ORDER — ONDANSETRON HCL 4 MG/2ML IJ SOLN
4.0000 mg | Freq: Once | INTRAMUSCULAR | Status: AC
  Administered 2016-12-04: 4 mg via INTRAVENOUS
  Filled 2016-12-04: qty 2

## 2016-12-04 MED ORDER — HYDRALAZINE HCL 50 MG PO TABS
100.0000 mg | ORAL_TABLET | Freq: Once | ORAL | Status: AC
  Administered 2016-12-04: 100 mg via ORAL
  Filled 2016-12-04: qty 2

## 2016-12-04 MED ORDER — CLONIDINE HCL 0.1 MG PO TABS
0.3000 mg | ORAL_TABLET | Freq: Once | ORAL | Status: AC
  Administered 2016-12-04: 0.3 mg via ORAL
  Filled 2016-12-04: qty 3

## 2016-12-04 MED ORDER — GI COCKTAIL ~~LOC~~
30.0000 mL | Freq: Once | ORAL | Status: AC
  Administered 2016-12-04: 30 mL via ORAL
  Filled 2016-12-04: qty 30

## 2016-12-04 MED ORDER — OMEPRAZOLE 20 MG PO CPDR: 20.0000 mg | DELAYED_RELEASE_CAPSULE | Freq: Every day | ORAL | 0 refills | Status: DC

## 2016-12-04 NOTE — ED Notes (Signed)
Urinal provided, pt aware urine is needed

## 2016-12-04 NOTE — ED Provider Notes (Signed)
[facility]-EMERGENCY DEPT Provider Note   CSN: [id number] Arrival date & time: 12/04/16  4098     History   Chief Complaint Chief Complaint  Patient presents with  . Abdominal Pain    HPI Louis Yang is a 51 y.o. male.  51yo M w/ PMH including HTN, polysubstance use who p/w abdominal pain. The patient reports recent problems with abdominal pain and distention particularly after eating. His symptoms have been normal worse over the past 4-5 days including crampy, generalized abdominal pain associated with nausea and vomiting, worse with eating. His last episode of vomiting was last night. His last bowel movement was 2 days ago and was normal but several days ago he did notice a small amount of blood in his stool. He denies any fevers but does endorse sweating. He reports nocturia that is a chronic ongoing problem for him. He has a chronic cough. Last used cocaine 2 days ago. He has not taken his high blood pressure medication for the past several days.   The history is provided by the patient.  Abdominal Pain      Past Medical History:  Diagnosis Date  . Hypertension     Patient Active Problem List   Diagnosis Date Noted  . Essential hypertension 05/11/2016  . 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 Acute kidney injury (HCC) 05/06/2016  . Tobacco abuse 05/06/2016  . Cocaine abuse 05/06/2016  . LVH (left ventricular hypertrophy) 05/06/2016  . Demand ischemia of myocardium (HCC) 05/06/2016  . Elevated troponin I level 05/06/2016  . Hyperlipidemia 05/06/2016  . Chronic left shoulder pain 05/06/2016  . Chest pain 05/05/2016    Past Surgical History:  Procedure Laterality Date  . NECK SURGERY         Home Medications    Prior to Admission medications   Medication Sig Start Date End Date Taking? Authorizing Provider  aspirin (ASPIRIN CHILDRENS) 81 MG chewable tablet Chew 1 tablet (81 mg total) by mouth daily. 05/06/16  Yes Dhungel, Nishant, MD  amLODipine (NORVASC)  10 MG tablet Take 1 tablet (10 mg total) by mouth daily. 12/04/16   Aodhan Scheidt, Ambrose Finland, MD  atorvastatin (LIPITOR) 20 MG tablet Take 1 tablet (20 mg total) by mouth daily. 12/04/16   Izza Bickle, Ambrose Finland, MD  cloNIDine (CATAPRES) 0.3 MG tablet Take 1 tablet (0.3 mg total) by mouth 3 (three) times daily. 12/04/16   Janavia Rottman, Ambrose Finland, MD  hydrALAZINE (APRESOLINE) 100 MG tablet Take 1 tablet (100 mg total) by mouth 3 (three) times daily. 12/04/16   Azlynn Mitnick, Ambrose Finland, MD  omeprazole (PRILOSEC) 20 MG capsule Take 1 capsule (20 mg total) by mouth daily. 12/04/16 12/18/16  Kortez Murtagh, Ambrose Finland, MD    Family History Family History  Problem Relation Age of Onset  . Hypertension Mother   . Hypertension Father   . Diabetes Father     Social History Social History  Substance Use Topics  . Smoking status: Current Every Day Smoker    Types: Cigarettes  . Smokeless tobacco: Never Used  . Alcohol use Yes     Comment: social     Allergies   Patient has no known allergies.   Review of Systems Review of Systems  Gastrointestinal: Positive for abdominal pain.   All other systems reviewed and are negative except that which was mentioned in HPI  Physical Exam Updated Vital Signs BP 140/83   Pulse 65   Temp 97.8 F (36.6 C) (Oral)   Resp 16   SpO2 99%  Physical Exam  Constitutional: He is oriented to person, place, and time. He appears well-developed and well-nourished. No distress.  HENT:  Head: Normocephalic and atraumatic.  Moist mucous membranes  Eyes: Conjunctivae are normal. Pupils are equal, round, and reactive to light.  Neck: Neck supple.  Cardiovascular: Normal rate, regular rhythm and normal heart sounds.   No murmur heard. Pulmonary/Chest: Effort normal and breath sounds normal.  Abdominal: Soft. Bowel sounds are normal. He exhibits no distension. There is tenderness (generalized). There is no rebound and no guarding.  Musculoskeletal: He exhibits no edema.    Neurological: He is alert and oriented to person, place, and time.  Fluent speech  Skin: Skin is warm and dry.  Psychiatric: He has a normal mood and affect. Judgment normal.  Nursing note and vitals reviewed.    ED Treatments / Results  Labs (all labs ordered are listed, but only abnormal results are displayed) Labs Reviewed  COMPREHENSIVE METABOLIC PANEL - Abnormal; Notable for the following:       Result Value   Glucose, Bld 121 (*)    BUN 28 (*)    Creatinine, Ser 2.63 (*)    Total Bilirubin 1.3 (*)    GFR calc non Af Amer 27 (*)    GFR calc Af Amer 31 (*)    All other components within normal limits  CBC - Abnormal; Notable for the following:    RDW 16.5 (*)    All other components within normal limits  URINALYSIS, ROUTINE W REFLEX MICROSCOPIC - Abnormal; Notable for the following:    Color, Urine STRAW (*)    All other components within normal limits  LIPASE, BLOOD    EKG  EKG Interpretation None       Radiology Ct Abdomen Pelvis Wo Contrast  Result Date: 12/04/2016 CLINICAL DATA:  Generalized abdominal pain, vomiting and constipation. EXAM: CT ABDOMEN AND PELVIS WITHOUT CONTRAST TECHNIQUE: Multidetector CT imaging of the abdomen and pelvis was performed following the standard protocol without IV contrast. COMPARISON:  05/11/2016 abdominal radiograph. FINDINGS: Lower chest: Solid subpleural 3 mm anterior right middle lobe pulmonary nodule (series 4/image 5). Hepatobiliary: Normal liver size. Hypodense 0.5 cm lateral segment left liver lobe lesion, too small to characterize, for which no further follow-up is required unless the patient has risk factors for liver malignancy. No additional liver lesions. Normal gallbladder with no radiopaque cholelithiasis. No biliary ductal dilatation. Pancreas: Normal, with no mass or duct dilation. Spleen: Normal size. No mass. Adrenals/Urinary Tract: Left adrenal 1.2 cm adenoma with density -6 HU. Normal right adrenal. Simple 1.7 cm  medial upper right renal cyst. Otherwise no contour deforming renal lesions. Nonobstructing 2 mm upper left renal stone. No additional renal stones. No hydronephrosis. Normal caliber ureters, with no ureteral stones. There is a tiny 0.5 cm low-attenuation focus in the midline bladder wall at the dome (series 2/ image 73), which has the appearance of minimal focal fatty change on the coronal sequence, unlikely to be clinically significant. Otherwise normal bladder. Stomach/Bowel: Grossly normal stomach. Normal caliber small bowel with no small bowel wall thickening. Normal appendix. Normal large bowel with no diverticulosis, large bowel wall thickening or pericolonic fat stranding. Oral contrast progresses to the proximal colon. Vascular/Lymphatic: Atherosclerotic nonaneurysmal abdominal aorta. No pathologically enlarged lymph nodes in the abdomen or pelvis. Reproductive: Normal size prostate. Other: No pneumoperitoneum, ascites or focal fluid collection. Musculoskeletal: No aggressive appearing focal osseous lesions. Mild thoracolumbar spondylosis. IMPRESSION: 1. No acute abnormality. No evidence of bowel obstruction or  acute bowel inflammation. 2. Tiny nonobstructing left renal stone.  No hydronephrosis. 3. Aortic atherosclerosis. 4. Left adrenal adenoma. 5. Right middle lobe subpleural 3 mm solid pulmonary nodule, probably benign . No follow-up needed if patient is low-risk. Non-contrast chest CT can be considered in 12 months if patient is high-risk. This recommendation follows the consensus statement: Guidelines for Management of Incidental Pulmonary Nodules Detected on CT Images: From the Fleischner Society 2017; Radiology 2017; 284:228-243. Electronically Signed   By: Delbert PhenixJason A Poff M.D.   On: 12/04/2016 12:09    Procedures Procedures (including critical care time)  Medications Ordered in ED Medications  sodium chloride 0.9 % bolus 1,000 mL (0 mLs Intravenous Stopped 12/04/16 1407)  cloNIDine (CATAPRES)  tablet 0.3 mg (0.3 mg Oral Given 12/04/16 1046)  hydrALAZINE (APRESOLINE) tablet 100 mg (100 mg Oral Given 12/04/16 1054)  amLODipine (NORVASC) tablet 10 mg (10 mg Oral Given 12/04/16 1046)  ondansetron (ZOFRAN) injection 4 mg (4 mg Intravenous Given 12/04/16 1048)  iopamidol (ISOVUE-300) 61 % injection 15 mL (15 mLs Oral Contrast Given 12/04/16 1108)  gi cocktail (Maalox,Lidocaine,Donnatal) (30 mLs Oral Given 12/04/16 1335)     Initial Impression / Assessment and Plan / ED Course  I have reviewed the triage vital signs and the nursing notes.  Pertinent labs & imaging results that were available during my care of the patient were reviewed by me and considered in my medical decision making (see chart for details).     PT w/ 4-5 days of worsening generalized abd pain with N/V, constipation. He was nontoxic on exam, vital signs notable for BP 161/106, patient has not had his medications today. He had generalized abdominal tenderness that was not associated with significant distention. Obtained above lab work which shows normal LFTs and lipase, normal CBC. The patient's creatinine is 2.6 which is worse than last years values. I suspect that it is likely related to his poorly controlled hypertension from medication noncompliance. Obtained CT of abdomen and pelvis given the patient's tenderness on exam. CT was negative for acute findings to explain his symptoms. Gave the patient a GI cocktail after which he was able to drink and eat a sandwich with no problems.  I spent time educating the patient on the importance of taking hypertension medications and following low-salt diet. Also recommended starting PPI, avoiding NSAIDs, and avoiding foods high in acidity. Emphasized the importance of follow-up with PCP for ongoing management of his hypertension. Provided refills on his BP medications. Reviewed return precautions. Patient voiced understanding and was discharged in satisfactory condition.  Final Clinical  Impressions(s) / ED Diagnoses   Final diagnoses:  Abdominal pain, unspecified abdominal location  Chronic kidney disease, unspecified CKD stage  Essential hypertension    New Prescriptions Discharge Medication List as of 12/04/2016  1:55 PM    START taking these medications   Details  omeprazole (PRILOSEC) 20 MG capsule Take 1 capsule (20 mg total) by mouth daily., Starting Sat 12/04/2016, Until Sat 12/18/2016, Print         Corwin Kuiken, Ambrose Finlandachel Morgan, MD 12/04/16 [phone]

## 2016-12-04 NOTE — ED Notes (Signed)
Pt has been given [place] sandwich and ginger ale for fluid/po challenge.

## 2016-12-04 NOTE — Discharge Instructions (Signed)
IT IS IMPORTANT FOR YOU TO TAKE ALL OF YOUR BLOOD PRESSURE MEDICINES AND FOLLOW UP WITH A PRIMARY CARE PROVIDER FOR YOUR BLOOD PRESSURE. YOUR KIDNEYS HAVE BEEN DAMAGED BY YOUR HIGH BLOOD PRESSURE.

## 2017-01-03 ENCOUNTER — Inpatient Hospital Stay (HOSPITAL_COMMUNITY)
Admission: AD | Admit: 2017-01-03 | Discharge: 2017-01-10 | DRG: 885 | Disposition: A | Discharge status: Home / Self Care | Payer: Federal, State, Local not specified - Other | Source: Intra-hospital | Attending: Emergency Medicine | Admitting: Emergency Medicine

## 2017-01-03 ENCOUNTER — Emergency Department (HOSPITAL_COMMUNITY): Payer: Medicaid - Out of State

## 2017-01-03 ENCOUNTER — Emergency Department (HOSPITAL_COMMUNITY)
Admission: EM | Admit: 2017-01-03 | Discharge: 2017-01-03 | Disposition: A | Discharge status: Other Acute Inpatient Hospital | Payer: Medicaid - Out of State | Attending: Emergency Medicine | Admitting: Emergency Medicine

## 2017-01-03 ENCOUNTER — Encounter (HOSPITAL_COMMUNITY): Payer: Self-pay | Admitting: Emergency Medicine

## 2017-01-03 ENCOUNTER — Encounter (HOSPITAL_COMMUNITY): Payer: Self-pay | Admitting: *Deleted

## 2017-01-03 DIAGNOSIS — I252 Old myocardial infarction: Secondary | ICD-10-CM

## 2017-01-03 DIAGNOSIS — Z8249 Family history of ischemic heart disease and other diseases of the circulatory system: Secondary | ICD-10-CM | POA: Diagnosis not present

## 2017-01-03 DIAGNOSIS — F329 Major depressive disorder, single episode, unspecified: Secondary | ICD-10-CM | POA: Diagnosis present

## 2017-01-03 DIAGNOSIS — F1024 Alcohol dependence with alcohol-induced mood disorder: Secondary | ICD-10-CM | POA: Diagnosis not present

## 2017-01-03 DIAGNOSIS — F1410 Cocaine abuse, uncomplicated: Secondary | ICD-10-CM | POA: Insufficient documentation

## 2017-01-03 DIAGNOSIS — R45851 Suicidal ideations: Secondary | ICD-10-CM | POA: Diagnosis not present

## 2017-01-03 DIAGNOSIS — R05 Cough: Secondary | ICD-10-CM

## 2017-01-03 DIAGNOSIS — F1910 Other psychoactive substance abuse, uncomplicated: Secondary | ICD-10-CM

## 2017-01-03 DIAGNOSIS — R062 Wheezing: Secondary | ICD-10-CM

## 2017-01-03 DIAGNOSIS — Z7952 Long term (current) use of systemic steroids: Secondary | ICD-10-CM

## 2017-01-03 DIAGNOSIS — Z79899 Other long term (current) drug therapy: Secondary | ICD-10-CM | POA: Insufficient documentation

## 2017-01-03 DIAGNOSIS — F17210 Nicotine dependence, cigarettes, uncomplicated: Secondary | ICD-10-CM | POA: Insufficient documentation

## 2017-01-03 DIAGNOSIS — F323 Major depressive disorder, single episode, severe with psychotic features: Secondary | ICD-10-CM | POA: Diagnosis present

## 2017-01-03 DIAGNOSIS — F1020 Alcohol dependence, uncomplicated: Secondary | ICD-10-CM | POA: Diagnosis present

## 2017-01-03 DIAGNOSIS — G4700 Insomnia, unspecified: Secondary | ICD-10-CM | POA: Diagnosis present

## 2017-01-03 DIAGNOSIS — I10 Essential (primary) hypertension: Secondary | ICD-10-CM | POA: Diagnosis present

## 2017-01-03 DIAGNOSIS — R059 Cough, unspecified: Secondary | ICD-10-CM

## 2017-01-03 DIAGNOSIS — F1490 Cocaine use, unspecified, uncomplicated: Secondary | ICD-10-CM | POA: Diagnosis not present

## 2017-01-03 DIAGNOSIS — R0602 Shortness of breath: Secondary | ICD-10-CM

## 2017-01-03 DIAGNOSIS — Z7982 Long term (current) use of aspirin: Secondary | ICD-10-CM | POA: Diagnosis not present

## 2017-01-03 DIAGNOSIS — F1420 Cocaine dependence, uncomplicated: Secondary | ICD-10-CM | POA: Diagnosis present

## 2017-01-03 HISTORY — DX: Unspecified osteoarthritis, unspecified site: M19.90

## 2017-01-03 HISTORY — DX: Acute myocardial infarction, unspecified: I21.9

## 2017-01-03 LAB — CBC
HCT: 40.1 % (ref 39.0–52.0)
Hemoglobin: 13.5 g/dL (ref 13.0–17.0)
MCH: 28.2 pg (ref 26.0–34.0)
MCHC: 33.7 g/dL (ref 30.0–36.0)
MCV: 83.9 fL (ref 78.0–100.0)
Platelets: 313 10*3/uL (ref 150–400)
RBC: 4.78 MIL/uL (ref 4.22–5.81)
RDW: 16 % — ABNORMAL HIGH (ref 11.5–15.5)
WBC: 7.2 10*3/uL (ref 4.0–10.5)

## 2017-01-03 LAB — COMPREHENSIVE METABOLIC PANEL
ALT: 18 U/L (ref 17–63)
AST: 31 U/L (ref 15–41)
Albumin: 3.9 g/dL (ref 3.5–5.0)
Alkaline Phosphatase: 70 U/L (ref 38–126)
Anion gap: 12 (ref 5–15)
BUN: 23 mg/dL — ABNORMAL HIGH (ref 6–20)
CO2: 21 mmol/L — ABNORMAL LOW (ref 22–32)
Calcium: 9.2 mg/dL (ref 8.9–10.3)
Chloride: 105 mmol/L (ref 101–111)
Creatinine, Ser: 2.66 mg/dL — ABNORMAL HIGH (ref 0.61–1.24)
GFR calc Af Amer: 30 mL/min — ABNORMAL LOW (ref >60)
GFR calc non Af Amer: 26 mL/min — ABNORMAL LOW (ref >60)
Glucose, Bld: 106 mg/dL — ABNORMAL HIGH (ref 65–99)
Potassium: 3.3 mmol/L — ABNORMAL LOW (ref 3.5–5.1)
Sodium: 138 mmol/L (ref 135–145)
Total Bilirubin: 0.9 mg/dL (ref 0.3–1.2)
Total Protein: 6.5 g/dL (ref 6.5–8.1)

## 2017-01-03 LAB — RAPID URINE DRUG SCREEN, HOSP PERFORMED
Amphetamines: NOT DETECTED
Barbiturates: NOT DETECTED
Benzodiazepines: NOT DETECTED
Cocaine: POSITIVE — AB
Opiates: NOT DETECTED
Tetrahydrocannabinol: NOT DETECTED

## 2017-01-03 LAB — I-STAT TROPONIN, ED: Troponin i, poc: 0.03 ng/mL (ref 0.00–0.08)

## 2017-01-03 MED ORDER — LOPERAMIDE HCL 2 MG PO CAPS
2.0000 mg | ORAL_CAPSULE | ORAL | Status: DC | PRN
Start: 2017-01-03 — End: 2017-01-04

## 2017-01-03 MED ORDER — ADULT MULTIVITAMIN W/MINERALS CH
1.0000 | ORAL_TABLET | Freq: Every day | ORAL | Status: DC
  Administered 2017-01-04: 1 via ORAL
  Filled 2017-01-03 (×4): qty 1

## 2017-01-03 MED ORDER — ALUM & MAG HYDROXIDE-SIMETH 200-200-20 MG/5ML PO SUSP
30.0000 mL | ORAL | Status: DC | PRN
  Administered 2017-01-09: 30 mL via ORAL
  Filled 2017-01-03: qty 30

## 2017-01-03 MED ORDER — TRAZODONE HCL 50 MG PO TABS
50.0000 mg | ORAL_TABLET | Freq: Every evening | ORAL | Status: DC | PRN
  Administered 2017-01-03 – 2017-01-04 (×2): 50 mg via ORAL
  Filled 2017-01-03 (×2): qty 1

## 2017-01-03 MED ORDER — HYDROXYZINE HCL 25 MG PO TABS: 25.0000 mg | ORAL_TABLET | Freq: Four times a day (QID) | ORAL | Status: DC | PRN

## 2017-01-03 MED ORDER — AMLODIPINE BESYLATE 5 MG PO TABS
10.0000 mg | ORAL_TABLET | Freq: Every day | ORAL | Status: DC
  Administered 2017-01-03: 10 mg via ORAL
  Filled 2017-01-03: qty 2

## 2017-01-03 MED ORDER — HYDRALAZINE HCL 50 MG PO TABS
100.0000 mg | ORAL_TABLET | Freq: Three times a day (TID) | ORAL | Status: DC
  Administered 2017-01-03 (×2): 100 mg via ORAL
  Filled 2017-01-03 (×3): qty 2

## 2017-01-03 MED ORDER — LORAZEPAM 2 MG/ML IJ SOLN: 0.0000 mg | Freq: Two times a day (BID) | INTRAMUSCULAR | Status: DC

## 2017-01-03 MED ORDER — ASPIRIN 81 MG PO CHEW
81.0000 mg | CHEWABLE_TABLET | Freq: Every day | ORAL | Status: DC
  Administered 2017-01-04 – 2017-01-10 (×7): 81 mg via ORAL
  Filled 2017-01-03: qty 7
  Filled 2017-01-03 (×10): qty 1
  Filled 2017-01-03: qty 7

## 2017-01-03 MED ORDER — ACETAMINOPHEN 325 MG PO TABS
650.0000 mg | ORAL_TABLET | Freq: Four times a day (QID) | ORAL | Status: DC | PRN
  Administered 2017-01-03 – 2017-01-04 (×2): 650 mg via ORAL
  Filled 2017-01-03 (×2): qty 2

## 2017-01-03 MED ORDER — ASPIRIN 81 MG PO CHEW
81.0000 mg | CHEWABLE_TABLET | Freq: Every day | ORAL | Status: DC
  Administered 2017-01-03: 81 mg via ORAL
  Filled 2017-01-03: qty 1

## 2017-01-03 MED ORDER — THIAMINE HCL 100 MG/ML IJ SOLN: 100.0000 mg | Freq: Every day | INTRAMUSCULAR | Status: DC

## 2017-01-03 MED ORDER — MAGNESIUM HYDROXIDE 400 MG/5ML PO SUSP: 30.0000 mL | Freq: Every day | ORAL | Status: DC | PRN

## 2017-01-03 MED ORDER — CLONIDINE HCL 0.2 MG PO TABS
0.3000 mg | ORAL_TABLET | Freq: Three times a day (TID) | ORAL | Status: DC
  Administered 2017-01-04 – 2017-01-10 (×20): 0.3 mg via ORAL
  Filled 2017-01-03 (×6): qty 1
  Filled 2017-01-03 (×2): qty 31
  Filled 2017-01-03 (×9): qty 1
  Filled 2017-01-03: qty 31
  Filled 2017-01-03: qty 3
  Filled 2017-01-03 (×2): qty 1
  Filled 2017-01-03 (×2): qty 31
  Filled 2017-01-03: qty 1
  Filled 2017-01-03: qty 3
  Filled 2017-01-03 (×6): qty 1
  Filled 2017-01-03: qty 31
  Filled 2017-01-03: qty 1

## 2017-01-03 MED ORDER — LORAZEPAM 1 MG PO TABS
1.0000 mg | ORAL_TABLET | Freq: Four times a day (QID) | ORAL | Status: DC | PRN
  Administered 2017-01-04: 1 mg via ORAL
  Filled 2017-01-03: qty 1

## 2017-01-03 MED ORDER — VITAMIN B-1 100 MG PO TABS
100.0000 mg | ORAL_TABLET | Freq: Every day | ORAL | Status: DC
  Administered 2017-01-03: 100 mg via ORAL
  Filled 2017-01-03: qty 1

## 2017-01-03 MED ORDER — ONDANSETRON 4 MG PO TBDP: 4.0000 mg | ORAL_TABLET | Freq: Four times a day (QID) | ORAL | Status: DC | PRN

## 2017-01-03 MED ORDER — AMLODIPINE BESYLATE 5 MG PO TABS: 10.0000 mg | ORAL_TABLET | Freq: Every day | ORAL | Status: DC

## 2017-01-03 MED ORDER — LORAZEPAM 2 MG/ML IJ SOLN: 0.0000 mg | Freq: Four times a day (QID) | INTRAMUSCULAR | Status: DC

## 2017-01-03 MED ORDER — LORAZEPAM 1 MG PO TABS: 0.0000 mg | ORAL_TABLET | Freq: Two times a day (BID) | ORAL | Status: DC

## 2017-01-03 MED ORDER — CLONIDINE HCL 0.2 MG PO TABS
0.3000 mg | ORAL_TABLET | Freq: Three times a day (TID) | ORAL | Status: DC
  Administered 2017-01-03 (×2): 0.3 mg via ORAL
  Filled 2017-01-03 (×2): qty 2

## 2017-01-03 MED ORDER — POTASSIUM CHLORIDE CRYS ER 20 MEQ PO TBCR
40.0000 meq | EXTENDED_RELEASE_TABLET | Freq: Once | ORAL | Status: AC
  Administered 2017-01-03: 40 meq via ORAL
  Filled 2017-01-03: qty 2

## 2017-01-03 MED ORDER — HYDROXYZINE HCL 25 MG PO TABS
25.0000 mg | ORAL_TABLET | Freq: Three times a day (TID) | ORAL | Status: DC | PRN
  Administered 2017-01-06 – 2017-01-08 (×3): 25 mg via ORAL
  Filled 2017-01-03 (×2): qty 1
  Filled 2017-01-03: qty 20
  Filled 2017-01-03: qty 1

## 2017-01-03 MED ORDER — ATORVASTATIN CALCIUM 20 MG PO TABS
20.0000 mg | ORAL_TABLET | Freq: Every day | ORAL | Status: DC
  Filled 2017-01-03: qty 1

## 2017-01-03 MED ORDER — LORAZEPAM 1 MG PO TABS: 0.0000 mg | ORAL_TABLET | Freq: Four times a day (QID) | ORAL | Status: DC

## 2017-01-03 MED ORDER — LORAZEPAM 1 MG PO TABS
2.0000 mg | ORAL_TABLET | Freq: Once | ORAL | Status: AC
  Administered 2017-01-03: 2 mg via ORAL
  Filled 2017-01-03: qty 2

## 2017-01-03 MED ORDER — AMLODIPINE BESYLATE 10 MG PO TABS
10.0000 mg | ORAL_TABLET | Freq: Every day | ORAL | Status: DC
  Administered 2017-01-04 – 2017-01-10 (×8): 10 mg via ORAL
  Filled 2017-01-03: qty 7
  Filled 2017-01-03 (×6): qty 1
  Filled 2017-01-03: qty 7
  Filled 2017-01-03 (×4): qty 1

## 2017-01-03 MED ORDER — PANTOPRAZOLE SODIUM 40 MG PO TBEC
40.0000 mg | DELAYED_RELEASE_TABLET | Freq: Every day | ORAL | Status: DC
  Administered 2017-01-03: 40 mg via ORAL
  Filled 2017-01-03: qty 1

## 2017-01-03 NOTE — ED Notes (Signed)
[facility] to notify of pt's BP, she will call this RN back

## 2017-01-03 NOTE — ED Notes (Addendum)
Pt c/o chest pain and HTN at assessment with Dr. Randall AnKohut Endorses SI/HI/hallucinations

## 2017-01-03 NOTE — ED Notes (Signed)
With TTS 

## 2017-01-03 NOTE — ED Provider Notes (Signed)
[facility] Jama Flavorsobos. Records reviewed. Medically cleared for ongoing psych treatment. Vitals stable.   Lavera GuiseLiu, Dana Duo, MD 01/03/17 2150

## 2017-01-03 NOTE — ED Notes (Signed)
Placed dinner tray order 

## 2017-01-03 NOTE — ED Notes (Signed)
Dinner Tray Ordered 

## 2017-01-03 NOTE — ED Provider Notes (Signed)
[facility]-EMER[facility]ENCY DEPT Provider Note   CSN: [id number] Arrival date & time: 01/03/17  1243  By signing my name below, I, Louis Yang, attest that this documentation has been prepared under the direction and in the presence of [name], MD . Electronically Signed: Phillips Yang, Scribe. 01/03/2017. 4:04 PM.  History   Chief Complaint Chief Complaint  Patient presents with  . Detox   HPI Comments Louis Yang is a 51 y.o. male with a PMHx significant for HTN and polysubstance abuse, who presents to the Emergency Department requesting a detox.  He is on a x8 day binge of alcohol and crack-cocaine.  No active SI/HI plan, however states that he will "do whatever it takes" to stay in the hospital, as he does not want to leave.  He also admits to feeling angry when drinking. Some hallucinations and paranoia while high.  Last drug and alcohol use x30 minutes PTA. No hx of alcohol withdrawal seizures, however it is unclear if pt has ever attempted to stop drinking before.  Pt with complaints of chest pain, now resolved, and eye redness, itching and discharge over the last week, also resolved.  Pt denies experiencing any other acute sx.  The history is provided by the patient and medical records. No language interpreter was used.   Past Medical History:  Diagnosis Date  . Arthritis   . Hypertension   . MI (myocardial infarction) [facility])     Patient Active Problem List   Diagnosis Date Noted  . Essential hypertension 05/11/2016  . 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 Acute kidney injury (HCC) 05/06/2016  . Tobacco abuse 05/06/2016  . Cocaine abuse 05/06/2016  . LVH (left ventricular hypertrophy) 05/06/2016  . Demand ischemia of myocardium (HCC) 05/06/2016  . Elevated troponin I level 05/06/2016  . Hyperlipidemia 05/06/2016  . Chronic left shoulder pain 05/06/2016  . Chest pain 05/05/2016    Past Surgical History:  Procedure Laterality Date  . NECK SURGERY          Home Medications    Prior to Admission medications   Medication Sig Start Date End Date Taking? Authorizing Provider  amLODipine (NORVASC) 10 MG tablet Take 1 tablet (10 mg total) by mouth daily. 12/04/16   Little, Ambrose Finland, MD  aspirin (ASPIRIN CHILDRENS) 81 MG chewable tablet Chew 1 tablet (81 mg total) by mouth daily. 05/06/16   Dhungel, Nishant, MD  atorvastatin (LIPITOR) 20 MG tablet Take 1 tablet (20 mg total) by mouth daily. 12/04/16   Little, Ambrose Finland, MD  cloNIDine (CATAPRES) 0.3 MG tablet Take 1 tablet (0.3 mg total) by mouth 3 (three) times daily. 12/04/16   Little, Ambrose Finland, MD  hydrALAZINE (APRESOLINE) 100 MG tablet Take 1 tablet (100 mg total) by mouth 3 (three) times daily. 12/04/16   Little, Ambrose Finland, MD  omeprazole (PRILOSEC) 20 MG capsule Take 1 capsule (20 mg total) by mouth daily. 12/04/16 12/18/16  Little, Ambrose Finland, MD    Family History Family History  Problem Relation Age of Onset  . Hypertension Mother   . Hypertension Father   . Diabetes Father     Social History Social History  Substance Use Topics  . Smoking status: Current Every Day Smoker    Packs/day: 1.00    Types: Cigarettes  . Smokeless tobacco: Never Used  . Alcohol use Yes     Comment: over 1/5 of liquor, 6-7 40s, 4 bottles of wine per day     Allergies   Patient has  no known allergies.   Review of Systems Review of Systems  Constitutional: Negative for chills and fever.  Eyes: Positive for discharge, redness and itching.  Cardiovascular: Positive for chest pain.  Psychiatric/Behavioral: Positive for hallucinations.  All other systems reviewed and are negative.  Physical Exam Updated Vital Signs BP (!) 186/113 (BP Location: Right Arm)   Pulse 88   Temp 98.5 F (36.9 C) (Oral)   Resp 16   Ht 6' (1.829 m)   SpO2 97%   Physical Exam  Constitutional: He is oriented to person, place, and time. He appears well-developed and well-nourished.  HENT:  Head:  Normocephalic and atraumatic.  Eyes: EOM are normal.  Neck: Normal range of motion.  Cardiovascular: Normal rate, regular rhythm, normal heart sounds and intact distal pulses.   Pulmonary/Chest: Effort normal and breath sounds normal. No respiratory distress.  Abdominal: Soft. He exhibits no distension. There is no tenderness.  Musculoskeletal: Normal range of motion.  Neurological: He is alert and oriented to person, place, and time.  Skin: Skin is warm and dry.  Psychiatric:  Calm, cooperative.  Good insight. Doesn't appear to be responding to internal stimuli.    Nursing note and vitals reviewed.  ED Treatments / Results  DIAGNOSTIC STUDIES: Oxygen Saturation is 96% on room air, normal by my interpretation.    COORDINATION OF CARE: 3:51 PM Discussed treatment plan with pt at bedside and pt agreed to plan.  Labs (all labs ordered are listed, but only abnormal results are displayed) Labs Reviewed  COMPREHENSIVE METABOLIC PANEL - Abnormal; Notable for the following:       Result Value   Potassium 3.3 (*)    CO2 21 (*)    Glucose, Bld 106 (*)    BUN 23 (*)    Creatinine, Ser 2.66 (*)    GFR calc non Af Amer 26 (*)    GFR calc Af Amer 30 (*)    All other components within normal limits  CBC - Abnormal; Notable for the following:    RDW 16.0 (*)    All other components within normal limits  RAPID URINE DRUG SCREEN, HOSP PERFORMED - Abnormal; Notable for the following:    Cocaine POSITIVE (*)    All other components within normal limits  ETHANOL  I-STAT TROPOININ, ED    EKG  EKG Interpretation  Date/Time:  Monday January 03 2017 13:04:51 EDT Ventricular Rate:  92 PR Interval:  142 QRS Duration: 90 QT Interval:  388 QTC Calculation: 479 R Axis:   84 Text Interpretation:  Normal sinus rhythm Biatrial enlargement Nonspecific T wave abnormality Prolonged QT Abnormal ECG Confirmed by Juleen ChinaKOHUT  MD, Cabrini Ruggieri [phone]([id number]) on 01/03/2017 4:10:30 PM       Radiology Dg Chest 2  View  Result Date: 01/03/2017 CLINICAL DATA:  Intermittent diffuse chest pain. Smoker. Cocaine use. EXAM: CHEST  2 VIEW COMPARISON:  05/11/2016 FINDINGS: Normal sized heart. Tortuous aorta. Clear lungs. Unremarkable bones. IMPRESSION: No acute abnormality. Electronically Signed   By: Beckie SaltsSteven  Reid M.D.   On: 01/03/2017 14:19    Procedures Procedures (including critical care time)  Medications Ordered in ED Medications - No data to display   Initial Impression / Assessment and Plan / ED Course  I have reviewed the triage vital signs and the nursing notes.  Pertinent labs & imaging results that were available during my care of the patient were reviewed by me and considered in my medical decision making (see chart for details).  50yM with drug/alcohol problem. Long standing abuse. Denies SI or HI but "if you let me go I know I'll go out and hurt someone." Does not name anyone specifically.   CP recently but not currently. Troponin normal. EKG not acutely changed. Very hypertensive, but has a history of this on multiple medication that he is not compliant with. Home meds ordered. He is acting appropriately. Not tachycardic or tremulous. I think BP is primarily his baseline essential HTN which has been untreated. Renal function noted. Has CKD per review of records. Cr similar to last value. Mild hypokalemia. Supplemented.   He is medically cleared. Pending TTS evaluation.  Final Clinical Impressions(s) / ED Diagnoses   Final diagnoses:  Drug abuse  Essential hypertension    New Prescriptions New Prescriptions   No medications on file     [name], MD 01/03/17 1635

## 2017-01-03 NOTE — ED Notes (Addendum)
[facility] adult unit. Cobos is the accepting MD.

## 2017-01-03 NOTE — [facility] Counselor (Signed)
Cheron EveryLindsey Strader [facility] spoke to charge RN about pt not being medically clear due to high blood pressure. Medications have been ordered but not given. [facility].   [address]Hendrick Pavich DupuyerLPC, LCAS
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
## 2017-01-03 NOTE — ED Notes (Signed)
Pt from home with c/o request for Etoh and cocaine withdrawal.  Pt states he drinks over 1/5 of liquor, 4 bottles of wine, and 6-7 40 oz beers a day.  Pt reports he did approx $800 worth of cocaine from Thurs to Sun.  Pt reports he had anger issues while drinking yesterday with another gentleman but otherwise denies SI/HI.  Last use cocaine last night and etoh approx 30 minutes PTA.  NAD, A&O.

## 2017-01-04 DIAGNOSIS — F1024 Alcohol dependence with alcohol-induced mood disorder: Secondary | ICD-10-CM

## 2017-01-04 DIAGNOSIS — F329 Major depressive disorder, single episode, unspecified: Secondary | ICD-10-CM

## 2017-01-04 DIAGNOSIS — G4700 Insomnia, unspecified: Secondary | ICD-10-CM

## 2017-01-04 DIAGNOSIS — R45851 Suicidal ideations: Secondary | ICD-10-CM

## 2017-01-04 MED ORDER — ADULT MULTIVITAMIN W/MINERALS CH
1.0000 | ORAL_TABLET | Freq: Every day | ORAL | Status: DC
  Administered 2017-01-05 – 2017-01-10 (×6): 1 via ORAL
  Filled 2017-01-04 (×9): qty 1

## 2017-01-04 MED ORDER — ACETAMINOPHEN 500 MG PO TABS
1000.0000 mg | ORAL_TABLET | Freq: Four times a day (QID) | ORAL | Status: DC | PRN
  Administered 2017-01-04 – 2017-01-09 (×9): 1000 mg via ORAL
  Filled 2017-01-04 (×9): qty 2

## 2017-01-04 MED ORDER — ONDANSETRON 4 MG PO TBDP: 4.0000 mg | ORAL_TABLET | Freq: Four times a day (QID) | ORAL | Status: AC | PRN

## 2017-01-04 MED ORDER — LORAZEPAM 1 MG PO TABS
1.0000 mg | ORAL_TABLET | Freq: Four times a day (QID) | ORAL | Status: AC | PRN
  Administered 2017-01-05 (×2): 1 mg via ORAL
  Filled 2017-01-04 (×2): qty 1

## 2017-01-04 MED ORDER — LOPERAMIDE HCL 2 MG PO CAPS: 2.0000 mg | ORAL_CAPSULE | ORAL | Status: AC | PRN

## 2017-01-04 MED ORDER — NICOTINE POLACRILEX 2 MG MT GUM
2.0000 mg | CHEWING_GUM | OROMUCOSAL | Status: DC | PRN
  Administered 2017-01-06: 2 mg via ORAL
  Filled 2017-01-04: qty 1

## 2017-01-04 MED ORDER — VITAMIN B-1 100 MG PO TABS
100.0000 mg | ORAL_TABLET | Freq: Every day | ORAL | Status: DC
  Administered 2017-01-05 – 2017-01-10 (×6): 100 mg via ORAL
  Filled 2017-01-04 (×9): qty 1

## 2017-01-04 MED ORDER — POTASSIUM CHLORIDE CRYS ER 20 MEQ PO TBCR
40.0000 meq | EXTENDED_RELEASE_TABLET | Freq: Once | ORAL | Status: AC
  Administered 2017-01-04: 40 meq via ORAL
  Filled 2017-01-04 (×2): qty 2

## 2017-01-04 MED ORDER — HYDROXYZINE HCL 25 MG PO TABS: 25.0000 mg | ORAL_TABLET | Freq: Four times a day (QID) | ORAL | Status: DC | PRN

## 2017-01-04 MED ORDER — THIAMINE HCL 100 MG/ML IJ SOLN: 100.0000 mg | Freq: Once | INTRAMUSCULAR | Status: DC

## 2017-01-04 MED ORDER — METHOCARBAMOL 500 MG PO TABS
500.0000 mg | ORAL_TABLET | Freq: Four times a day (QID) | ORAL | Status: DC | PRN
  Administered 2017-01-04 – 2017-01-09 (×8): 500 mg via ORAL
  Filled 2017-01-04 (×8): qty 1

## 2017-01-04 NOTE — Progress Notes (Signed)
Nutrition Brief Note  Patient identified on the Malnutrition Screening Tool (MST) Report  Wt Readings from Last 15 Encounters:  01/03/17 270 lb (122.5 kg)  05/13/16 283 lb 14.4 oz (128.8 kg)  05/05/16 275 lb (124.7 kg)    Body mass index is 36.62 kg/m. Patient meets criteria for obesity based on current BMI. Pt has lost 13 lbs (4.6% body weight) in the past 8 months which is not significant for time frame. Pt admitted requesting detox from alcohol and drugs, both of which he has been consuming on an increased basis recently.   Current diet order is Regular and pt is eating as desired for meals and snacks. Labs and medications reviewed. No nutrition interventions warranted at this time. If nutrition issues arise, please consult RD.     Trenton GammonJessica Deniz Hannan, MS, RD, LDN, [facility] Inpatient Clinical Dietitian Pager # [phone] After hours/weekend pager # [phone]

## 2017-01-04 NOTE — [facility] Counselor (Signed)
Adult Comprehensive Assessment  Patient ID: Louis Wangerin., male   DOB: [birth year], 51 y.o.   MRN: [id number]  Information Source: Information source: Patient  Current Stressors:  Educational / Learning stressors: None reported Employment / Job issues: None reported Family Relationships: None reported Museum/gallery curator / Lack of resources (include bankruptcy): None reported Housing / Lack of housing: lives in [facility] which he reports is full of drugs Physical health (include injuries & life threatening diseases): None reported Social relationships: girlfriend uses substances Substance abuse: daily use of ETOH and crack Bereavement / Loss: NOne reported  Living/Environment/Situation:  Living Arrangements: Spouse/significant other Living conditions (as described by patient or guardian): neighborhood is full of drugs How long has patient lived in current situation?: 65moWhat is atmosphere in current home: Dangerous  Family History:  Marital status: Long term relationship Long term relationship, how long?: off and on since 2006 What types of issues is patient dealing with in the relationship?: partner uses substances as well Does patient have children?: No  Childhood History:  By whom was/is the patient raised?: Both parents Description of patient's relationship with caregiver when they were a child: great relationship with parents; all needs and wants met Patient's description of current relationship with people who raised him/her: continues to have close relationship with parents Does patient have siblings?: No Did patient suffer any verbal/emotional/physical/sexual abuse as a child?: No Did patient suffer from severe childhood neglect?: No Has patient ever been sexually abused/assaulted/raped as an adolescent or adult?: No Was the patient ever a victim of a crime or a disaster?: No Witnessed domestic violence?: No Has patient been effected by domestic violence as an adult?:  No  Education:  Highest grade of school patient has completed: 12th Currently a student?: No Learning disability?: No  Employment/Work Situation:   Employment situation: Employed 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 has patient been employed?: 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 with this company Patient's job has been impacted by current illness: No What is the longest time patient has a held a job?: "not long" Where was the patient employed at that time?: unknown Has patient ever been in the [employer]: No Has patient ever served in combat?: No Did You Receive Any Psychiatric Treatment/Services While in tPassenger transport manager: No Are There Guns or Other Weapons in [place]: No  Financial Resources:   Financial resources: Income from employment, Income from spouse Does patient have a rProgrammer, applicationsor guardian?: No  Alcohol/Substance Abuse:   What has been your use of drugs/alcohol within the last 12 months?: daily etoh and crack cocaine use- binging for 265mof attempted suicide, did drugs/alcohol play a role in this?: No Alcohol/Substance Abuse Treatment Hx: Past Tx, Inpatient, Past detox Has alcohol/substance abuse ever caused legal problems?: Yes  Social Support System:   Patient's Community Support System: Good Describe Community Support System: parents are very supportive Type of faith/religion: unknown How does patient's faith help to cope with current illness?: unknown  Leisure/Recreation:   Leisure and Hobbies: Pt did not state  Strengths/Needs:   What things does the patient do well?: unknown In what areas does patient struggle / problems for patient: unknown  Discharge Plan:   Does patient have access to transportation?: Yes Will patient be returning to same living situation after discharge?: Yes (however would like to go to residential treatment first) Currently receiving community mental health services: No If no, would patient like referral for  services when discharged?: Yes [facility] Does  patient have financial barriers related to discharge medications?: No  Summary/Recommendations:     Patient is a 51 year old male with a diagnosis of Alcohol Use Disorder and Major Depressive Disorder. Pt presented to the hospital with suicidal ideations and request for detox. Pt reports primary trigger(s) for admission include ongoing substance use and the desire to be in recovery. Patient will benefit from crisis stabilization, medication evaluation, group therapy and psycho education in addition to case management for discharge planning. At discharge it is recommended that Pt remain compliant with established discharge plan and continued treatment.   Gladstone Lighter. 01/04/2017

## 2017-01-04 NOTE — H&P (Addendum)
Psychiatric Admission Assessment Adult  Patient Identification: Louis Yang. MRN:  [id number] Date of Evaluation:  01/04/2017 Chief Complaint:  " drinking and drugging " Principal Diagnosis: Diagnosis:   Patient Active Problem List   Diagnosis Date Noted  . MDD (major depressive disorder) [F32.9] 01/03/2017  . Essential hypertension [I10] 05/11/2016  . 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 Acute kidney injury ([place]) [N17.9] 05/06/2016  . Tobacco abuse [Z72.0] 05/06/2016  . Cocaine abuse [F14.10] 05/06/2016  . LVH (left ventricular hypertrophy) [I51.7] 05/06/2016  . Demand ischemia of myocardium ([place]) [I24.8] 05/06/2016  . Elevated troponin I level [R74.8] 05/06/2016  . Hyperlipidemia [E78.5] 05/06/2016  . Chronic left shoulder pain [M25.512, G89.29] 05/06/2016  . Chest pain [R07.9] 05/05/2016   History of Present Illness: 51 year old male.  Presented to ER voluntarily . He states he has been drinking daily, heavily, particularly over the last two months. He also reports regular crack cocaine abuse . States " I am just so tired of using ". He states he was drinking up to 1/5th of liquor and several beers per day. Reports he has been feeling depressed. States he has had some passive SI, but currently denies any active suicidal ideations and is future oriented, wanting to go to a rehab at discharge   Associated Signs/Symptoms: Depression Symptoms:  depressed mood, anhedonia, insomnia, suicidal thoughts without plan, loss of energy/fatigue, decreased appetite, (Hypo) Manic Symptoms:   Denies  Anxiety Symptoms:  Does not endorse Psychotic Symptoms:  Reports " hearing noises" when intoxicated. None at this time PTSD Symptoms: does not endorse   Total Time spent with patient: 45 minutes  Past Psychiatric History: no prior psychiatric admissions . Has never attempted suicide.  Denies psychotic symptoms other than when intoxicated with cocaine - describes seeing  shadows and hearing noises at times . Describes history of depression, as above .  Denies history of violence .    Is the patient at risk to self? Yes.    Has the patient been a risk to self in the past 6 months? No.  Has the patient been a risk to self within the distant past? No.  Is the patient a risk to others? No.  Has the patient been a risk to others in the past 6 months? No.  Has the patient been a risk to others within the distant past? No.   Prior Inpatient Therapy:  denies  Prior Outpatient Therapy:  denies   Alcohol Screening: 1. How often do you have a drink containing alcohol?: 4 or more times a week 2. How many drinks containing alcohol do you have on a typical day when you are drinking?: 10 or more 3. How often do you have six or more drinks on one occasion?: Daily or almost daily Preliminary Score: 8 4. How often during the last year have you found that you were not able to stop drinking once you had started?: Daily or almost daily 5. How often during the last year have you failed to do what was normally expected from you becasue of drinking?: Daily or almost daily 6. How often during the last year have you needed a first drink in the morning to get yourself going after a heavy drinking session?: Daily or almost daily 7. How often during the last year have you had a feeling of guilt of remorse after drinking?: Weekly 8. How often during the last year have you been unable to remember what happened the night before because you  had been drinking?: Less than monthly 9. Have you or someone else been injured as a result of your drinking?: Yes, during the last year 10. Has a relative or friend or a doctor or another health worker been concerned about your drinking or suggested you cut down?: Yes, during the last year Alcohol Use Disorder Identification Test Final Score (AUDIT): 36 Brief Intervention: Yes Substance Abuse History in the last 12 months:  Alcohol dependence, cocaine  dependence  Consequences of Substance Abuse: Denies withdrawal seizures , denies DTs, history of DUIs, denies blackouts  Previous Psychotropic Medications: denies  Psychological Evaluations: No  Past Medical History:  Past Medical History:  Diagnosis Date  . Arthritis   . Hypertension   . MI (myocardial infarction) [facility])     Past Surgical History:  Procedure Laterality Date  . NECK SURGERY     Family History: Parents alive, live together. Has no siblings. Family History  Problem Relation Age of Onset  . Hypertension Mother   . Hypertension Father   . Diabetes Father    Family Psychiatric  History:  Denies family history of mental illness, no suicides in family, states " a lot of people in my family drink but I am the only alcoholic "  Tobacco Screening: Have you used any form of tobacco in the last 30 days? (Cigarettes, Smokeless Tobacco, Cigars, and/or Pipes): Yes Tobacco use, Select all that apply: 5 or more cigarettes per day Are you interested in Tobacco Cessation Medications?: Yes, will notify MD for an order Counseled patient on smoking cessation including recognizing danger situations, developing coping skills and basic information about quitting provided: Refused/Declined practical counseling Social History: single, has one daughter , aged 62. Lives with GF. Reports legal issues regarding driving without tags . History  Alcohol Use  . Yes    Comment: over 1/5 of liquor, 6-7 40s, 4 bottles of wine per day     History  Drug Use  . Types: Cocaine    Comment: $800 worth in 4 days    Additional Social History: Marital status: Long term relationship Long term relationship, how long?: off and on since 2006 What types of issues is patient dealing with in the relationship?: partner uses substances as well Does patient have children?: No  Allergies:  No Known Allergies Lab Results:  Results for orders placed or performed during the hospital encounter of 01/03/17 (from  the past 48 hour(s))  Comprehensive metabolic panel     Status: Abnormal   Collection Time: 01/03/17  1:42 PM  Result Value Ref Range   Sodium 138 135 - 145 mmol/L   Potassium 3.3 (L) 3.5 - 5.1 mmol/L   Chloride 105 101 - 111 mmol/L   CO2 21 (L) 22 - 32 mmol/L   Glucose, Bld 106 (H) 65 - 99 mg/dL   BUN 23 (H) 6 - 20 mg/dL   Creatinine, Ser 2.66 (H) 0.61 - 1.24 mg/dL   Calcium 9.2 8.9 - 10.3 mg/dL   Total Protein 6.5 6.5 - 8.1 g/dL   Albumin 3.9 3.5 - 5.0 g/dL   AST 31 15 - 41 U/L   ALT 18 17 - 63 U/L   Alkaline Phosphatase 70 38 - 126 U/L   Total Bilirubin 0.9 0.3 - 1.2 mg/dL   GFR calc non Af Amer 26 (L) >60 mL/min   GFR calc Af Amer 30 (L) >60 mL/min    Comment: (NOTE) The eGFR has been calculated using the CKD EPI equation. This calculation has  not been validated in all clinical situations. eGFR's persistently <60 mL/min signify possible Chronic Kidney Disease.    Anion gap 12 5 - 15  cbc     Status: Abnormal   Collection Time: 01/03/17  1:42 PM  Result Value Ref Range   WBC 7.2 4.0 - 10.5 K/uL   RBC 4.78 4.22 - 5.81 MIL/uL   Hemoglobin 13.5 13.0 - 17.0 g/dL   HCT 40.1 39.0 - 52.0 %   MCV 83.9 78.0 - 100.0 fL   MCH 28.2 26.0 - 34.0 pg   MCHC 33.7 30.0 - 36.0 g/dL   RDW 16.0 (H) 11.5 - 15.5 %   Platelets 313 150 - 400 K/uL  Rapid urine drug screen (hospital performed)     Status: Abnormal   Collection Time: 01/03/17  1:46 PM  Result Value Ref Range   Opiates NONE DETECTED NONE DETECTED   Cocaine POSITIVE (A) NONE DETECTED   Benzodiazepines NONE DETECTED NONE DETECTED   Amphetamines NONE DETECTED NONE DETECTED   Tetrahydrocannabinol NONE DETECTED NONE DETECTED   Barbiturates NONE DETECTED NONE DETECTED    Comment:        DRUG SCREEN FOR MEDICAL PURPOSES ONLY.  IF CONFIRMATION IS NEEDED FOR ANY PURPOSE, NOTIFY LAB WITHIN 5 DAYS.        LOWEST DETECTABLE LIMITS FOR URINE DRUG SCREEN Drug Class       Cutoff (ng/mL) Amphetamine      1000 Barbiturate       200 Benzodiazepine   676 Tricyclics       195 Opiates          300 Cocaine          300 THC              50   I-Stat Troponin, ED (not at [facility])     Status: None   Collection Time: 01/03/17  2:05 PM  Result Value Ref Range   Troponin i, poc 0.03 0.00 - 0.08 ng/mL   Comment 3            Comment: Due to the release kinetics of cTnI, a negative result within the first hours of the onset of symptoms does not rule out myocardial infarction with certainty. If myocardial infarction is still suspected, repeat the test at appropriate intervals.     Blood Alcohol level:  No results found for: [facility]  Metabolic Disorder Labs:  No results found for: HGBA1C, MPG No results found for: PROLACTIN No results found for: CHOL, TRIG, HDL, CHOLHDL, VLDL, LDLCALC  Current Medications: Current Facility-Administered Medications  Medication Dose Route Frequency Provider Last Rate Last Dose  . acetaminophen (TYLENOL) tablet 1,000 mg  1,000 mg Oral Q6H PRN Lindell Spar I, NP   1,000 mg at 01/04/17 1620  . alum & mag hydroxide-simeth (MAALOX/MYLANTA) 200-200-20 MG/5ML suspension 30 mL  30 mL Oral Q4H PRN Okonkwo, [name], NP      . amLODipine (NORVASC) tablet 10 mg  10 mg Oral Daily Okonkwo, [name], NP   10 mg at 01/04/17 0836  . aspirin chewable tablet 81 mg  81 mg Oral Daily Okonkwo, [name], NP   81 mg at 01/04/17 0836  . cloNIDine (CATAPRES) tablet 0.3 mg  0.3 mg Oral TID Lu Duffel, [name], NP   0.3 mg at 01/04/17 1620  . hydrOXYzine (ATARAX/VISTARIL) tablet 25 mg  25 mg Oral Q6H PRN Okonkwo, [name], NP      . hydrOXYzine (ATARAX/VISTARIL) tablet 25 mg  25 mg Oral TID PRN Lu Duffel, [name], NP      . loperamide (IMODIUM) capsule 2-4 mg  2-4 mg Oral PRN Okonkwo, [name], NP      . LORazepam (ATIVAN) tablet 1 mg  1 mg Oral Q6H PRN Okonkwo, [name], NP   1 mg at 01/04/17 1620  . magnesium hydroxide (MILK OF MAGNESIA) suspension 30 mL  30 mL Oral Daily PRN Okonkwo, [name], NP      .  methocarbamol (ROBAXIN) tablet 500 mg  500 mg Oral Q6H PRN Lindell Spar I, NP   500 mg at 01/04/17 1620  . multivitamin with minerals tablet 1 tablet  1 tablet Oral Daily Okonkwo, [name], NP   1 tablet at 01/04/17 0836  . nicotine polacrilex (NICORETTE) gum 2 mg  2 mg Oral PRN Sherlonda Flater, Myer Peer, MD      . ondansetron (ZOFRAN-ODT) disintegrating tablet 4 mg  4 mg Oral Q6H PRN Okonkwo, [name], NP      . traZODone (DESYREL) tablet 50 mg  50 mg Oral QHS PRN Okonkwo, [name], NP   50 mg at 01/03/17 2322   PTA Medications: Prescriptions Prior to Admission  Medication Sig Dispense Refill Last Dose  . amLODipine (NORVASC) 10 MG tablet Take 1 tablet (10 mg total) by mouth daily. (Patient not taking: Reported on 01/03/2017) 30 tablet 0 Not Taking at Unknown time  . aspirin (ASPIRIN CHILDRENS) 81 MG chewable tablet Chew 1 tablet (81 mg total) by mouth daily. (Patient not taking: Reported on 01/03/2017) 30 tablet 0 Not Taking at Unknown time  . atorvastatin (LIPITOR) 20 MG tablet Take 1 tablet (20 mg total) by mouth daily. (Patient not taking: Reported on 01/03/2017) 30 tablet 0 Not Taking at Unknown time  . cloNIDine (CATAPRES) 0.3 MG tablet Take 1 tablet (0.3 mg total) by mouth 3 (three) times daily. (Patient not taking: Reported on 01/03/2017) 90 tablet 0 Not Taking at Unknown time  . hydrALAZINE (APRESOLINE) 100 MG tablet Take 1 tablet (100 mg total) by mouth 3 (three) times daily. (Patient not taking: Reported on 01/03/2017) 90 tablet 0 Not Taking at Unknown time  . omeprazole (PRILOSEC) 20 MG capsule Take 1 capsule (20 mg total) by mouth daily. 14 capsule 0     Musculoskeletal: Strength & Muscle Tone: within normal limits Gait & Station: normal Patient leans: N/A  Psychiatric Specialty Exam: Physical Exam  Review of Systems  Constitutional: Negative.   HENT: Negative.   Eyes: Positive for redness.  Respiratory: Negative.   Cardiovascular: Negative for chest pain and palpitations.   Gastrointestinal: Negative.   Genitourinary: Negative.   Musculoskeletal: Negative.   Skin: Negative.   Neurological: Positive for headaches.  Endo/Heme/Allergies: Negative.   Psychiatric/Behavioral: Positive for hallucinations and substance abuse.  All other systems reviewed and are negative.   Blood pressure (!) 145/114, pulse 71, temperature 98.1 F (36.7 C), temperature source Oral, resp. rate 16, height 6' (1.829 m), weight 122.5 kg (270 lb).Body mass index is 36.62 kg/m.  Repeat BP at 4,45 PM 133/87   General Appearance: Fairly Groomed  Eye Contact:  Fair  Speech:  Normal Rate  Volume:  Normal  Mood:  reports he is feeling " a little bit better today"   Affect:  mildly constricted but reactive   Thought Process:  Linear and Descriptions of Associations: Intact  Orientation:  Other:  fully alert and attentive   Thought Content:  denies hallucinations, no delusions, not internally preoccupied at this  time   Suicidal Thoughts:  No denies suicidal or self injurious ideations , no homicidal or violent ideations   Homicidal Thoughts:  No  Memory:  recent and remote grossly intact   Judgement:  Fair  Insight:  Fair  Psychomotor Activity:  Normal- no tremors, no diaphoresis, not restless or agitated   Concentration:  Concentration: Good and Attention Span: Good  Recall:  Good  Fund of Knowledge:  Good  Language:  Good  Akathisia:  Negative  Handed:  Right  AIMS (if indicated):     Assets:  Communication Skills Desire for Improvement Resilience  ADL's:  Intact  Cognition:  WNL  Sleep:  Number of Hours: 5.75    Treatment Plan Summary: Daily contact with patient to assess and evaluate symptoms and progress in treatment, Medication management, Plan inpatient admission  and medications as below  Observation Level/Precautions:  15 minute checks  Laboratory:  as needed   Psychotherapy:  Milieu, support, group therapy   Medications: Ativan detox protocol. Will continue to  monitor mood and consider initiating an antidepressant if he continues to feel depressed in the context of sobriety. Currently restarted on Clonidine , Norvasc,     Consultations:  I have discussed case with Hospitalist- Dr. Julaine Hua- to discuss kidney function and HTN management .   Discharge Concerns:  - patient states that he is wanting to go to a rehab at discharge  Estimated LOS: 5-6 days   Other:     Physician Treatment Plan for Primary Diagnosis:  Alcohol Dependence Long Term Goal(s): Improvement in symptoms so as ready for discharge  Short Term Goals: Ability to identify triggers associated with substance abuse/mental health issues will improve  Physician Treatment Plan for Secondary Diagnosis: Depression- consider substance induced mood disorder   Long Term Goal(s): Improvement in symptoms so as ready for discharge  Short Term Goals: Ability to verbalize feelings will improve, Ability to disclose and discuss suicidal ideas, Ability to demonstrate self-control will improve, Ability to identify and develop effective coping behaviors will improve and Ability to maintain clinical measurements within normal limits will improve  I certify that inpatient services furnished can reasonably be expected to improve the patient's condition.    Jenne Campus, MD 6/5/20184:27 PM

## 2017-01-04 NOTE — [facility] Group Notes (Signed)
[facility] LCSW Group Therapy 01/04/2017 1:15 PM  Type of Therapy: Group Therapy- Feelings about Diagnosis  Participation Level: Pt invited. Did not attend.  Jonathon JordanLynn B Idrissa Beville, MSW, Theresia MajorsLCSWA [phone] 01/04/2017 2:35 PM\

## 2017-01-04 NOTE — [facility] Suicide Risk Assessment (Signed)
[facility] INPATIENT:  Family/Significant Other Suicide Prevention Education  Suicide Prevention Education:  Patient Refusal for Family/Significant Other Suicide Prevention Education: The patient Louis Yang. has refused to provide written consent for family/significant other to be provided Family/Significant Other Suicide Prevention Education during admission and/or prior to discharge.  Physician notified.  Verdene LennertLauren C Sommer Spickard 01/04/2017, 4:12 PM

## 2017-01-04 NOTE — [facility] Suicide Risk Assessment (Signed)
[facility]   Nursing information obtained from:   patient and chart  Demographic factors:   51 year old male , lives with GF  Current Mental Status:   See below  Loss Factors:   relapse Historical Factors:   history of alcohol and cocaine use disorder  Risk Reduction Factors:   resilience   Total Time spent with patient: 45 minutes Principal Problem: Alcohol Dependence, Cocaine Dependence, Substance Induced Mood Disorder  Diagnosis:   Patient Active Problem List   Diagnosis Date Noted  . MDD (major depressive disorder) [F32.9] 01/03/2017  . Essential hypertension [I10] 05/11/2016  . 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 Acute kidney injury ([facility]) [N17.9] 05/06/2016  . Tobacco abuse [Z72.0] 05/06/2016  . Cocaine abuse [F14.10] 05/06/2016  . LVH (left ventricular hypertrophy) [I51.7] 05/06/2016  . Demand ischemia of myocardium ([facility]) [I24.8] 05/06/2016  . Elevated troponin I level [R74.8] 05/06/2016  . Hyperlipidemia [E78.5] 05/06/2016  . Chronic left shoulder pain [M25.512, G89.29] 05/06/2016  . Chest pain [R07.9] 05/05/2016    Continued Clinical Symptoms:  Alcohol Use Disorder Identification Test Final Score (AUDIT): 36 The "Alcohol Use Disorders Identification Test", Guidelines for Use in Primary Care, Second Edition.  World Science writerHealth Organization [facility](WHO). Score between 0-7:  no or low risk or alcohol related problems. Score between 8-15:  moderate risk of alcohol related problems. Score between 16-19:  high risk of alcohol related problems. Score 20 or above:  warrants further diagnostic evaluation for alcohol dependence and treatment.   CLINICAL FACTORS:  51 year old male, history of alcohol and cocaine use disorders, presents with depression and seeking detox.  Psychiatric Specialty Exam: Physical Exam  ROS  Blood pressure (!) 145/114, pulse 71, temperature 98.1 F (36.7 C), temperature source Oral, resp. rate 16, height 6' (1.829  m), weight 122.5 kg (270 lb).Body mass index is 36.62 kg/m.   see admit note MSE    COGNITIVE FEATURES THAT CONTRIBUTE TO RISK:  Closed-mindedness and Loss of executive function    SUICIDE RISK:   Moderate:  Frequent suicidal ideation with limited intensity, and duration, some specificity in terms of plans, no associated intent, good self-control, limited dysphoria/symptomatology, some risk factors present, and identifiable protective factors, including available and accessible social support.  PLAN OF CARE: Admit patient to inpatient unit for the purpose of detoxification/ management of withdrawal. Provide support and encouragement regarding sobriety/abstinence efforts. Will follow daily.   I certify that inpatient services furnished can reasonably be expected to improve the patient's condition.   Craige CottaFernando A Cobos, MD 01/04/2017, 4:59 PM

## 2017-01-04 NOTE — Progress Notes (Signed)
Recreation Therapy Notes  Animal-Assisted Activity (AAA) Program Checklist/Progress Notes Patient Eligibility Criteria Checklist & Daily Group note for Rec TxIntervention  Date: 01/04/2017 Time: 1:55pm Location: 400 hall dayroom   AAA/T Program Assumption of Risk Form signed by Patient/ or Parent Legal Guardian No  Patient refused to participate.  Behavioral Response: Patient did not attend.  Marvell Fullerachel Yaris Ferrell, Recreation Therapy Intern

## 2017-01-04 NOTE — Progress Notes (Signed)
51 year old male pt admitted on voluntary basis. [facility] help with detox from alcohol as well as help with depression and suicidal thoughts. Eswin reports that he has increased his alcohol and drug consumption recently and has had thoughts of suicide during this time. Tarance reports that he drinks on a daily basis and spoke about going to a treatment facility when he gets discharged from here. Dalon reports that he has been on medications in the past and is suppose to be on them now but is not currently taking them. He reports that he would like to get detoxed and also put back on his medications while here. Jontez does report passive SI but is able to contract for safety on the unit. Massey was oriented and safety maintained.

## 2017-01-04 NOTE — [facility] Group Notes (Signed)
The focus of this group is to educate the patient on the purpose and policies of crisis stabilization and provide a format to answer questions about their admission.  The group details unit policies and expectations of patients while admitted.  Patient dd not attend 0900 nurse education orientation group this morning.  Patient stayed in bed.      

## 2017-01-04 NOTE — Tx Team (Signed)
Initial Treatment Plan 01/04/2017 12:47 AM Louis Sheral FlowFlowers Jr. JXB:[id number]    PATIENT STRESSORS: Health problems Medication change or noncompliance Substance abuse   PATIENT STRENGTHS: Ability for insight Average or above average intelligence Capable of independent living General fund of knowledge Motivation for treatment/growth   PATIENT IDENTIFIED PROBLEMS: Depression  Suicidal thoughts Substance Abuse "detox and make a plan for recovery"                     DISCHARGE CRITERIA:  Ability to meet basic life and health needs Improved stabilization in mood, thinking, and/or behavior Verbal commitment to aftercare and medication compliance Withdrawal symptoms are absent or subacute and managed without 24-hour nursing intervention  PRELIMINARY DISCHARGE PLAN: Attend aftercare/continuing care group Return to previous living arrangement  PATIENT/FAMILY INVOLVEMENT: This treatment plan has been presented to and reviewed with the patient, Louis Erikssonoscoe Taite Jr., and/or family member, .  The patient and family have been given the opportunity to ask questions and make suggestions.  Louis Yang, [place], [place] 01/04/2017, 12:47 AM

## 2017-01-05 MED ORDER — TRAZODONE HCL 50 MG PO TABS
25.0000 mg | ORAL_TABLET | Freq: Every evening | ORAL | Status: DC | PRN
  Filled 2017-01-05: qty 1

## 2017-01-05 NOTE — Progress Notes (Addendum)
Louis Yang Progress Note  01/05/2017 4:27 PM Louis Dowell Jr.  MRN:  [id number] Subjective:  Patient reports he is feeling partially better. He states he has mild symptoms of withdrawal, such as feeling jittery, but does not endorse significant tremors or diaphoresis. He does complain of excessive AM sedation related to Trazodone, does not want to completely stop this medication, as he states he slept better, but is hoping to cut down dose . Objective : I have discussed case with treatment team and have met with patient. Patient presents calm, in no acute distress, no psychomotor agitation or restlessness, no diaphoresis. He endorses AM sedation ,as above, at this time does not appear drowsy, and presents fully alert, attentive, and oriented . He is focused on going to a rehab after discharge. States he is experiencing cravings for both alcohol and cocaine and states " if I go back out like this I know I am going to relapse". No disruptive or agitated behaviors on unit, going to some groups.  Principal Problem: Depression, Alcohol, Cocaine Use Disorder  Diagnosis:   Patient Active Problem List   Diagnosis Date Noted  . MDD (major depressive disorder) [F32.9] 01/03/2017  . Essential hypertension [I10] 05/11/2016  . 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 Acute kidney injury ([place]) [N17.9] 05/06/2016  . Tobacco abuse [Z72.0] 05/06/2016  . Cocaine abuse [F14.10] 05/06/2016  . LVH (left ventricular hypertrophy) [I51.7] 05/06/2016  . Demand ischemia of myocardium ([place]) [I24.8] 05/06/2016  . Elevated troponin I level [R74.8] 05/06/2016  . Hyperlipidemia [E78.5] 05/06/2016  . Chronic left shoulder pain [M25.512, G89.29] 05/06/2016  . Chest pain [R07.9] 05/05/2016   Total Time spent with patient: 20 minutes Past Medical History:  Past Medical History:  Diagnosis Date  . Arthritis   . Hypertension   . MI (myocardial infarction) [facility])     Past Surgical History:  Procedure  Laterality Date  . NECK SURGERY     Family History:  Family History  Problem Relation Age of Onset  . Hypertension Mother   . Hypertension Father   . Diabetes Father    Social History:  History  Alcohol Use  . Yes    Comment: over 1/5 of liquor, 6-7 40s, 4 bottles of wine per day     History  Drug Use  . Types: Cocaine    Comment: $800 worth in 4 days    Social History   Social History  . Marital status: Single    Spouse name: Louis Yang  . Number of children: Louis Yang  . Years of education: Louis Yang   Social History Main Topics  . Smoking status: Current Every Day Smoker    Packs/day: 1.00    Types: Cigarettes  . Smokeless tobacco: Never Used  . Alcohol use Yes     Comment: over 1/5 of liquor, 6-7 40s, 4 bottles of wine per day  . Drug use: Yes    Types: Cocaine     Comment: $800 worth in 4 days  . Sexual activity: Yes   Other Topics Concern  . None   Social History Narrative  . None   Additional Social History:   Sleep: improved   Appetite:  Good  Current Medications: Current Facility-Administered Medications  Medication Dose Route Frequency Provider Last Rate Last Dose  . acetaminophen (TYLENOL) tablet 1,000 mg  1,000 mg Oral Q6H PRN Lindell Spar I, NP   1,000 mg at 01/05/17 0818  . alum & mag hydroxide-simeth (MAALOX/MYLANTA) 200-200-20 MG/5ML suspension 30 mL  30 mL Oral Q4H PRN Okonkwo, [name], NP      . amLODipine (NORVASC) tablet 10 mg  10 mg Oral Daily Okonkwo, [name], NP   10 mg at 01/05/17 0817  . aspirin chewable tablet 81 mg  81 mg Oral Daily Okonkwo, [name], NP   81 mg at 01/05/17 0816  . cloNIDine (CATAPRES) tablet 0.3 mg  0.3 mg Oral TID Lu Duffel, [name], NP   0.3 mg at 01/05/17 1210  . hydrOXYzine (ATARAX/VISTARIL) tablet 25 mg  25 mg Oral TID PRN Lu Duffel, [name], NP      . hydrOXYzine (ATARAX/VISTARIL) tablet 25 mg  25 mg Oral Q6H PRN Oluwasemilore Pascuzzi A, Yang      . loperamide (IMODIUM) capsule 2-4 mg  2-4 mg Oral PRN Traniyah Hallett, Myer Peer, Yang       . LORazepam (ATIVAN) tablet 1 mg  1 mg Oral Q6H PRN Renardo Cheatum, Myer Peer, Yang   1 mg at 01/05/17 0028  . magnesium hydroxide (MILK OF MAGNESIA) suspension 30 mL  30 mL Oral Daily PRN Okonkwo, [name], NP      . methocarbamol (ROBAXIN) tablet 500 mg  500 mg Oral Q6H PRN Lindell Spar I, NP   500 mg at 01/05/17 0028  . multivitamin with minerals tablet 1 tablet  1 tablet Oral Daily Terrian Sentell, Myer Peer, Yang   1 tablet at 01/05/17 0815  . nicotine polacrilex (NICORETTE) gum 2 mg  2 mg Oral PRN Maui Ahart, Myer Peer, Yang      . ondansetron (ZOFRAN-ODT) disintegrating tablet 4 mg  4 mg Oral Q6H PRN Ezequiel Macauley, Myer Peer, Yang      . thiamine (B-1) injection 100 mg  100 mg Intramuscular Once Forestine Macho A, Yang      . thiamine (VITAMIN B-1) tablet 100 mg  100 mg Oral Daily Faige Seely, Myer Peer, Yang   100 mg at 01/05/17 0817  . traZODone (DESYREL) tablet 25 mg  25 mg Oral QHS PRN Jaskirat Schwieger, Myer Peer, Yang        Lab Results: No results found for this or any previous visit (from the past 48 hour(s)).  Blood Alcohol level:  No results found for: [facility]  Metabolic Disorder Labs: No results found for: HGBA1C, MPG No results found for: PROLACTIN No results found for: CHOL, TRIG, HDL, CHOLHDL, VLDL, LDLCALC  Physical Findings: AIMS: Facial and Oral Movements Muscles of Facial Expression: None, normal Lips and Perioral Area: None, normal Jaw: None, normal Tongue: None, normal,Extremity Movements Upper (arms, wrists, hands, fingers): None, normal Lower (legs, knees, ankles, toes): None, normal, Trunk Movements Neck, shoulders, hips: None, normal, Overall Severity Severity of abnormal movements (highest score from questions above): None, normal Incapacitation due to abnormal movements: None, normal Patient's awareness of abnormal movements (rate only patient's report): No Awareness, Dental Status Current problems with teeth and/or dentures?: No Does patient usually wear dentures?: No  CIWA:  CIWA-Ar Total: 10 COWS:      Musculoskeletal: Strength & Muscle Tone: within normal limits- no tremors, no diaphoresis, no psychomotor restlessness or agitation Gait & Station: normal Patient leans: Louis Yang  Psychiatric Specialty Exam: Physical Exam  ROS denies headache, denies chest pain, no dyspnea. Less ocular erythema. States he had been experiencing ocular secretions prior to admission but now resolved .  Blood pressure (!) 142/100, pulse 66, temperature 97.8 F (36.6 C), temperature source Oral, resp. rate 16, height 6' (1.829 m), weight 122.5 kg (270 lb).Body mass index is 36.62 kg/m.  General Appearance:  Fairly Groomed  Eye Contact:  Good  Speech:  Normal Rate  Volume:  Normal  Mood:  improving mood  Affect:  appropriate, more reactive   Thought Process:  Linear and Descriptions of Associations: Intact  Orientation:  Full (Time, Place, and Person)  Thought Content:  denies hallucinations, no delusions expressed   Suicidal Thoughts:  No denies suicidal or self injurious ideations, denies homicidal or violent ideations   Homicidal Thoughts:  No  Memory:  recent and remote grossly intact   Judgement:  Fair- improving   Insight:  improving   Psychomotor Activity:  Normal- no tremors, no diaphoresis, no psychomotor agitation  Concentration:  Concentration: Good and Attention Span: Good  Recall:  Good  Fund of Knowledge:  Good  Language:  Good  Akathisia:  Negative  Handed:  Right  AIMS (if indicated):     Assets:  Communication Skills Desire for Improvement Resilience  ADL's:  Intact  Cognition:  WNL  Sleep:  Number of Hours: 6.5   Assessment - patient is presenting with improving mood , range of affect. At this time not presenting with severe alcohol WDL symptoms, and does not appear to be in any acute distress or discomfort. He has a history of HTN- BP improved compared to admission. He reports excessive AM sedation from Trazodone. At this time denies other medication side effects. He reports  cravings for alcohol and cocaine and is wanting to go to a rehab setting directly from unit, if possible. We discussed starting Campral for cravings but not interested stating he does not want to take too many medications and would have difficulty affording after discharge.    Treatment Plan Summary: Daily contact with patient to assess and evaluate symptoms and progress in treatment, Medication management, Plan inpatient admission and medications as below  Encourage group and milieu participation to work on coping skills and symptom reduction Continue Ativan PRNS for potential alcohol withdrawal symptoms Decrease Trazodone to 25 mgrs QHS PRN for insomnia Continue antihypertensive medication management Treatment team working on disposition planning options- wants to go to rehab at discharge  Check BMP in [place], Yang 01/05/2017, 4:27 PM

## 2017-01-05 NOTE — Tx Team (Addendum)
Interdisciplinary Treatment and Diagnostic Plan Update 01/10/2017 Time of Session: 9:30am  Louis Loews CorporationFlowers Jr.  MRN: [id number]  Principal Diagnosis: MDD  Secondary Diagnoses: Active Problems:   MDD (major depressive disorder)   Current Medications:  Current Facility-Administered Medications  Medication Dose Route Frequency Provider Last Rate Last Dose  . acetaminophen (TYLENOL) tablet 1,000 mg  1,000 mg Oral Q6H PRN [name], [name], NP   1,000 mg at 01/09/17 2115  . albuterol (PROVENTIL HFA;VENTOLIN HFA) 108 (90 Base) MCG/ACT inhaler 2 puff  2 puff Inhalation Q4H PRN Cobos, [name], MD   2 puff at 01/10/17 0818  . alum & mag hydroxide-simeth (MAALOX/MYLANTA) 200-200-20 MG/5ML suspension 30 mL  30 mL Oral Q4H PRN Okonkwo, [name], NP   30 mL at 01/09/17 2244  . amLODipine (NORVASC) tablet 10 mg  10 mg Oral Daily Okonkwo, [name], NP   10 mg at 01/10/17 0817  . aspirin chewable tablet 81 mg  81 mg Oral Daily Okonkwo, [name], NP   81 mg at 01/10/17 0817  . cloNIDine (CATAPRES) tablet 0.3 mg  0.3 mg Oral TID Beryle Lathekonkwo, [name], NP   0.3 mg at 01/10/17 0818  . hydrALAZINE (APRESOLINE) tablet 25 mg  25 mg Oral Q8H Cobos, [name], MD   25 mg at 01/10/17 [id number]  . hydrOXYzine (ATARAX/VISTARIL) tablet 25 mg  25 mg Oral TID PRN Ferne Reuskonkwo, [name], NP   25 mg at 01/08/17 2145  . ipratropium-albuterol (DUONEB) 0.5-2.5 (3) MG/3ML nebulizer solution 3 mL  3 mL Nebulization Q6H Cobos, Fernando A, MD   3 mL at 01/09/17 1440  . magnesium hydroxide (MILK OF MAGNESIA) suspension 30 mL  30 mL Oral Daily PRN Okonkwo, [name], NP      . methocarbamol (ROBAXIN) tablet 500 mg  500 mg Oral Q6H PRN [name], [name], NP   500 mg at 01/09/17 2115  . mirtazapine (REMERON) tablet 15 mg  15 mg Oral QHS [name], Tanika N, NP   15 mg at 01/09/17 2112  . multivitamin with minerals tablet 1 tablet  1 tablet Oral Daily Cobos, [name], MD   1 tablet at 01/10/17 0817  . nicotine polacrilex (NICORETTE) gum 2 mg  2  mg Oral PRN Cobos, [name], MD   2 mg at 01/06/17 0829  . [START ON 01/13/2017] predniSONE (DELTASONE) tablet 10 mg  10 mg Oral Q breakfast Cobos, Fernando A, MD      . predniSONE (DELTASONE) tablet 20 mg  20 mg Oral Q breakfast Cobos, [name], MD   20 mg at 01/10/17 0817  . thiamine (VITAMIN B-1) tablet 100 mg  100 mg Oral Daily Cobos, [name], MD   100 mg at 01/10/17 0818    PTA Medications: Prescriptions Prior to Admission  Medication Sig Dispense Refill Last Dose  . amLODipine (NORVASC) 10 MG tablet Take 1 tablet (10 mg total) by mouth daily. (Patient not taking: Reported on 01/03/2017) 30 tablet 0 Not Taking at Unknown time  . aspirin (ASPIRIN CHILDRENS) 81 MG chewable tablet Chew 1 tablet (81 mg total) by mouth daily. (Patient not taking: Reported on 01/03/2017) 30 tablet 0 Not Taking at Unknown time  . atorvastatin (LIPITOR) 20 MG tablet Take 1 tablet (20 mg total) by mouth daily. (Patient not taking: Reported on 01/03/2017) 30 tablet 0 Not Taking at Unknown time  . cloNIDine (CATAPRES) 0.3 MG tablet Take 1 tablet (0.3 mg total) by mouth 3 (three) times daily. (Patient not taking: Reported  on 01/03/2017) 90 tablet 0 Not Taking at Unknown time  . hydrALAZINE (APRESOLINE) 100 MG tablet Take 1 tablet (100 mg total) by mouth 3 (three) times daily. (Patient not taking: Reported on 01/03/2017) 90 tablet 0 Not Taking at Unknown time  . omeprazole (PRILOSEC) 20 MG capsule Take 1 capsule (20 mg total) by mouth daily. 14 capsule 0     Treatment Modalities: Medication Management, Group therapy, Case management,  1 to 1 session with clinician, Psychoeducation, Recreational therapy.  Patient Stressors: Health problems Medication change or noncompliance Substance abuse Patient Strengths: Ability for insight Average or above average intelligence Capable of independent living General fund of knowledge Motivation for treatment/growth  Physician Treatment Plan for Primary Diagnosis: MDD Long Term  Goal(s): Improvement in symptoms so as ready for discharge Short Term Goals: Ability to identify triggers associated with substance abuse/mental health issues will improve Ability to verbalize feelings will improve Ability to disclose and discuss suicidal ideas Ability to demonstrate self-control will improve Ability to identify and develop effective coping behaviors will improve Ability to maintain clinical measurements within normal limits will improve  Medication Management: Evaluate patient's response, side effects, and tolerance of medication regimen.  Therapeutic Interventions: 1 to 1 sessions, Unit Group sessions and Medication administration.  Evaluation of Outcomes: Adequate for Discharge  Physician Treatment Plan for Secondary Diagnosis: Active Problems:   MDD (major depressive disorder)  Long Term Goal(s): Improvement in symptoms so as ready for discharge  Short Term Goals: Ability to identify triggers associated with substance abuse/mental health issues will improve Ability to verbalize feelings will improve Ability to disclose and discuss suicidal ideas Ability to demonstrate self-control will improve Ability to identify and develop effective coping behaviors will improve Ability to maintain clinical measurements within normal limits will improve  Medication Management: Evaluate patient's response, side effects, and tolerance of medication regimen.  Therapeutic Interventions: 1 to 1 sessions, Unit Group sessions and Medication administration.  Evaluation of Outcomes: Adequate for Discharge  RN Treatment Plan for Primary Diagnosis: MDD Long Term Goal(s): Knowledge of disease and therapeutic regimen to maintain health will improve  Short Term Goals: Ability to remain free from injury will improve  Medication Management: RN will administer medications as ordered by provider, will assess and evaluate patient's response and provide education to patient for prescribed  medication. RN will report any adverse and/or side effects to prescribing provider.  Therapeutic Interventions: 1 on 1 counseling sessions, Psychoeducation, Medication administration, Evaluate responses to treatment, Monitor vital signs and CBGs as ordered, Perform/monitor CIWA, COWS, AIMS and Fall Risk screenings as ordered, Perform wound care treatments as ordered.  Evaluation of Outcomes: Adequate for Discharge  LCSW Treatment Plan for Primary Diagnosis: MDD Long Term Goal(s): Safe transition to appropriate next level of care at discharge, Engage patient in therapeutic group addressing interpersonal concerns. Short Term Goals: Engage patient in aftercare planning with referrals and resources, Increase emotional regulation and Increase skills for wellness and recovery  Therapeutic Interventions: Assess for all discharge needs, 1 to 1 time with Social worker, Explore available resources and support systems, Assess for adequacy in community support network, Educate family and significant other(s) on suicide prevention, Complete Psychosocial Assessment, Interpersonal group therapy.  Evaluation of Outcomes: Adequate for Discharge  Progress in Treatment: Attending groups: Yes Participating in groups: Yes Taking medication as prescribed: Yes, MD continues to assess for medication changes as needed Toleration medication: Yes, no side effects reported at this time Family/Significant other contact made: No, pt declined contact Patient understands diagnosis:  Continuing to assess; patient states that he will make decision to remain abstinent from alcohol/drugs; declines all supports other than information about 12 step groups Discussing patient identified problems/goals with staff: Yes Medical problems stabilized or resolved: Yes; MD asking RN CM to make referral for PCP appt to manage HTN and renal issues Denies suicidal/homicidal ideation: Yes, per MD and RN and patient self report Issues/concerns  per patient self-inventory: None Other: N/A  New problem(s) identified: None identified at this time.   New Short Term/Long Term Goal(s): Ensure patient has list of 12 step groups; patient declined mental health follow up  Discharge Plan or Barriers: ready for DC, none at this time.   Reason for Continuation of Hospitalization:  Depression Medication stabilization Suicidal ideation Withdrawal symptoms  Estimated Length of Stay: 3-5 days; DC today 6/11  Attendees: Patient: 01/10/2017 9:57 AM  Physician: Dr. Jama Flavors 01/10/2017 9:57 AM  Nursing: Lanora Manis RN 01/10/2017 9:57 AM  RN Care Manager:  01/10/2017 9:57 AM  Social Worker: Santa Genera LCSW 01/10/2017 9:57 AM  Recreational Therapist:  01/10/2017 9:57 AM  Other:  01/10/2017 9:57 AM  Other:  01/10/2017 9:57 AM  Other: 01/10/2017 9:57 AM  Scribe for Treatment Team: Santa Genera, LCSW Lead Clinical Social Worker Phone:  [phone]  01/10/2017 9:57 AM

## 2017-01-05 NOTE — Progress Notes (Signed)
Louis Yang had been up and visible in milieu this evening, has appeared flat and depressed and did not attend evening group activity. He did endorse some anxiety and sweating in regards to his withdrawal and did verbalize pain and did request and receive medications this evening. A. Support and encouragement provided. R. Safety maintained, will continue to monitor.

## 2017-01-05 NOTE — [facility] Group Notes (Signed)
[facility] Group Therapy 01/05/2017 1:15pm  Type of Therapy: [facility] Presentation  Participation Level: Pt invited. Did not attend.   Corra Kaine B. Treylen Gibbs, MSW, LCSWA 01/05/2017 3:13 PM   

## 2017-01-05 NOTE — Progress Notes (Signed)
D: Patient presents with flat affect and irritable mood.  His main complaint today is shoulder pain bilaterally.  Patient states, "It's come from years of use."  Patient is sleeping well and denies any severe withdrawal symptoms.  He was given tylenol for shoulder pain.  Patient denies any thoughts of self harm today.  He is observed in the day room interacting with his peers.  He has been visible in the milieu, however, has not attended any groups today. A: Continue to monitor medication management and MD orders.  Safety checks completed every 15 minutes per protocol.  Offer support and encouragement as needed. R: Patient is receptive to staff; his behavior is appropriate.

## 2017-01-05 NOTE — Progress Notes (Signed)
Recreation Therapy Notes  Date: 01/05/17 Time: 0930 Location: [address] Dayroom  Group Topic: Stress Management  Goal Area(s) Addresses:  Patient will verbalize importance of using healthy stress management.  Patient will identify positive emotions associated with healthy stress management.   Intervention: Stress Management  Activity :  Progressive Muscle Relaxation.  LRT introduced the stress management technique of progressive muscle relaxation.  LRT read a script to allow patients to participate in PMR which allowed them to tense and relax each muscle group individually.  Patients were to follow along with the script to fully engage in the activity.  Education:  Stress Management, Discharge Planning.   Education Outcome: Acknowledges edcuation/In group clarification offered/Needs additional education  Clinical Observations/Feedback: Pt did not attend group.   Thayden Lemire, LRT/CTRS         Jonell Brumbaugh A 01/05/2017 11:47 AM 

## 2017-01-05 NOTE — Progress Notes (Signed)
Adult Psychoeducational Group Note  Date:  01/05/2017 Time:  8:49 PM  Group Topic/Focus:  Wrap-Up Group:   The focus of this group is to help patients review their daily goal of treatment and discuss progress on daily workbooks.  Participation Level:  Active  Participation Quality:  Appropriate and Attentive  Affect:  Appropriate  Cognitive:  Alert and Appropriate  Insight: Appropriate, Good and Improving  Engagement in Group:  Engaged  Modes of Intervention:  Discussion  Additional Comments:  Patient stated his goal for today was to talk with his doctor about cutting the dosage on his medication, so he can function throughout the day. Patient stated he felt pretty good when he achieved his goal for today. Patient rated his overall day a 10 out of 10. Patient stated something positive that happen today was he found out he is attending another treatment facilities for 30 days after he leaves here. Patient stated his goal for tomorrow was to work on his discharge plan.  Felipa FurnaceChristopher  Satonya Lux 01/05/2017, 8:49 PM

## 2017-01-06 ENCOUNTER — Encounter (HOSPITAL_COMMUNITY): Payer: Self-pay | Admitting: Emergency Medicine

## 2017-01-06 ENCOUNTER — Other Ambulatory Visit: Payer: Self-pay

## 2017-01-06 ENCOUNTER — Observation Stay (HOSPITAL_COMMUNITY): Payer: Federal, State, Local not specified - Other

## 2017-01-06 LAB — CBC
HCT: 34.8 % — ABNORMAL LOW (ref 39.0–52.0)
Hemoglobin: 11.5 g/dL — ABNORMAL LOW (ref 13.0–17.0)
MCH: 28.7 pg (ref 26.0–34.0)
MCHC: 33 g/dL (ref 30.0–36.0)
MCV: 86.8 fL (ref 78.0–100.0)
Platelets: 215 10*3/uL (ref 150–400)
RBC: 4.01 MIL/uL — AB (ref 4.22–5.81)
RDW: 16.7 % — ABNORMAL HIGH (ref 11.5–15.5)
WBC: 6.2 10*3/uL (ref 4.0–10.5)

## 2017-01-06 LAB — BRAIN NATRIURETIC PEPTIDE: B Natriuretic Peptide: 501.5 pg/mL — ABNORMAL HIGH (ref 0.0–100.0)

## 2017-01-06 LAB — BASIC METABOLIC PANEL
Anion gap: 8 (ref 5–15)
Anion gap: 8 (ref 5–15)
BUN: 25 mg/dL — ABNORMAL HIGH (ref 6–20)
BUN: 27 mg/dL — ABNORMAL HIGH (ref 6–20)
CALCIUM: 8.7 mg/dL — AB (ref 8.9–10.3)
CHLORIDE: 107 mmol/L (ref 101–111)
CO2: 24 mmol/L (ref 22–32)
CO2: 25 mmol/L (ref 22–32)
CREATININE: 2.07 mg/dL — AB (ref 0.61–1.24)
CREATININE: 2.19 mg/dL — AB (ref 0.61–1.24)
Calcium: 9.4 mg/dL (ref 8.9–10.3)
Chloride: 105 mmol/L (ref 101–111)
GFR calc non Af Amer: 35 mL/min — ABNORMAL LOW (ref >60)
GFR, EST AFRICAN AMERICAN: 41 mL/min — AB (ref >60)
GFR, EST AFRICAN AMERICAN: 38 mL/min — AB (ref >60)
GFR, EST NON AFRICAN AMERICAN: 33 mL/min — AB (ref >60)
Glucose, Bld: 93 mg/dL (ref 65–99)
Glucose, Bld: 207 mg/dL — ABNORMAL HIGH (ref 65–99)
Potassium: 5.4 mmol/L — ABNORMAL HIGH (ref 3.5–5.1)
Potassium: 4.5 mmol/L (ref 3.5–5.1)
SODIUM: 140 mmol/L (ref 135–145)
Sodium: 137 mmol/L (ref 135–145)

## 2017-01-06 LAB — I-STAT TROPONIN, ED: TROPONIN I, POC: 0.02 ng/mL (ref 0.00–0.08)

## 2017-01-06 MED ORDER — ALBUTEROL SULFATE HFA 108 (90 BASE) MCG/ACT IN AERS
2.0000 | INHALATION_SPRAY | RESPIRATORY_TRACT | Status: DC | PRN
  Administered 2017-01-07 – 2017-01-10 (×6): 2 via RESPIRATORY_TRACT

## 2017-01-06 MED ORDER — PREDNISONE 20 MG PO TABS
40.0000 mg | ORAL_TABLET | Freq: Every day | ORAL | Status: AC
  Administered 2017-01-07 – 2017-01-09 (×3): 40 mg via ORAL
  Filled 2017-01-06 (×3): qty 2

## 2017-01-06 MED ORDER — MIRTAZAPINE 7.5 MG PO TABS
7.5000 mg | ORAL_TABLET | Freq: Every day | ORAL | Status: DC
  Administered 2017-01-06 – 2017-01-07 (×2): 7.5 mg via ORAL
  Filled 2017-01-06 (×3): qty 1

## 2017-01-06 MED ORDER — IPRATROPIUM BROMIDE 0.02 % IN SOLN
0.5000 mg | Freq: Once | RESPIRATORY_TRACT | Status: AC
  Administered 2017-01-06: 0.5 mg via RESPIRATORY_TRACT

## 2017-01-06 MED ORDER — PREDNISONE 10 MG PO TABS
20.0000 mg | ORAL_TABLET | Freq: Every day | ORAL | Status: DC
  Administered 2017-01-10: 20 mg via ORAL
  Filled 2017-01-06: qty 1
  Filled 2017-01-06: qty 7
  Filled 2017-01-06: qty 1
  Filled 2017-01-06 (×2): qty 7

## 2017-01-06 MED ORDER — ALBUTEROL SULFATE (2.5 MG/3ML) 0.083% IN NEBU
5.0000 mg | INHALATION_SOLUTION | Freq: Once | RESPIRATORY_TRACT | Status: AC
Start: 2017-01-06 — End: 2017-01-06
  Administered 2017-01-06: 5 mg via RESPIRATORY_TRACT

## 2017-01-06 MED ORDER — IPRATROPIUM-ALBUTEROL 0.5-2.5 (3) MG/3ML IN SOLN
3.0000 mL | Freq: Four times a day (QID) | RESPIRATORY_TRACT | Status: DC
  Administered 2017-01-06 – 2017-01-09 (×9): 3 mL via RESPIRATORY_TRACT
  Filled 2017-01-06 (×24): qty 3

## 2017-01-06 MED ORDER — ALBUTEROL SULFATE HFA 108 (90 BASE) MCG/ACT IN AERS
2.0000 | INHALATION_SPRAY | Freq: Once | RESPIRATORY_TRACT | Status: AC
  Administered 2017-01-06: 2 via RESPIRATORY_TRACT

## 2017-01-06 MED ORDER — AZITHROMYCIN 250 MG PO TABS: 250.0000 mg | ORAL_TABLET | Freq: Every day | ORAL | 0 refills | Status: AC

## 2017-01-06 MED ORDER — PREDNISONE 10 MG PO TABS: 10.0000 mg | ORAL_TABLET | Freq: Every day | ORAL | Status: DC

## 2017-01-06 MED ORDER — PREDNISONE 20 MG PO TABS
60.0000 mg | ORAL_TABLET | Freq: Once | ORAL | Status: AC
  Administered 2017-01-06: 60 mg via ORAL

## 2017-01-06 MED ORDER — AZITHROMYCIN 500 MG PO TABS
500.0000 mg | ORAL_TABLET | Freq: Once | ORAL | Status: AC
  Administered 2017-01-06: 500 mg via ORAL
  Filled 2017-01-06: qty 1

## 2017-01-06 MED ORDER — AZITHROMYCIN 250 MG PO TABS
250.0000 mg | ORAL_TABLET | Freq: Every day | ORAL | Status: AC
  Administered 2017-01-07 – 2017-01-10 (×4): 250 mg via ORAL
  Filled 2017-01-06 (×4): qty 1

## 2017-01-06 MED ORDER — IPRATROPIUM BROMIDE 0.02 % IN SOLN: 0.5000 mg | Freq: Four times a day (QID) | RESPIRATORY_TRACT | Status: DC

## 2017-01-06 MED ORDER — PREDNISONE 20 MG PO TABS
ORAL_TABLET | ORAL | 0 refills | Status: DC
Start: 2017-01-06 — End: 2017-01-10

## 2017-01-06 NOTE — ED Triage Notes (Addendum)
Per EMS, [facility]. Pt reports last night that he started having some non-productive, dry coughing episodes with some associated sob. Pt states it feels like his stomach is full and has difficulty during inspiration when lying down. Pt reports that he has had 4 episodes of diarrhea since last night. Pt reports 4/10 rt sided chest pain that started this morning. Hx of MI and HTN. Pt has not taken his BP medications yet this morning. EMS gave 324 ASA. Lungs clear. EMS EKG showed T-wave inversion. EMS VS BP 170/100, RR 20, HR 70s, 98% room air.

## 2017-01-06 NOTE — Progress Notes (Signed)
Louis Yang had been up and visible in milieu this evening, did attend and participate in evening group activity. He spoke about having a good day today and spoke about going to a long term treatment facility from here. Louis Yang has shown some signs of withdrawal this evening including sweating and anxiety and was able to receive all bedtime medications without incident. A. Support and encouragement provided. R. Safety maintained, will continue to monitor.

## 2017-01-06 NOTE — Progress Notes (Signed)
D: [facility]  Patient went over during early morning hours due to shortness of breath, hypertension and chest pain.  Patient returned after being medically cleared.  Orders placed for nebulizer treatments and inhaler due dx of pneumonia.  [facility] and was missing for 10 minutes.  Patient states, "I went outside and smoked a cigarette."  Patient states that he "bummed a cigarette from someone outside."  [facility] [place] with sitter.  Patient states that he was irritable and felt "they were not addressing my symptoms."  Patient was complaining about sitting in the "room waiting for someone to give me a breathing treatment."  Patient is calm at this time and his behavior is appropriate.  He denies any thoughts of self harm or withdrawal symptoms.  He is pleasant and joking with his peers.   A: Continue to monitor medication management and MD orders.  Safety checks completed every 15 minutes per protocol.  Offer support and encouragement as needed. R: Patient is receptive to staff; his behavior is appropriate.

## 2017-01-06 NOTE — Plan of Care (Signed)
Problem: Education: Goal: Verbalization of understanding the information provided will improve Outcome: Progressing Discussed patient's symptoms such as shortness of breath and chest discomfort.  Educated patient on the importance of smoking cessation.

## 2017-01-06 NOTE — Progress Notes (Signed)
Louis Yang has complained of chest pain and shortness of breath this morning. Vital signs were taken 177/124, 72 and 99% room air. Rosoce described a worsening of the pain mid-sternal, ems was called and ED notified of impending arrival.

## 2017-01-06 NOTE — [facility] Group Notes (Signed)
[facility] LCSW Group Therapy 01/06/2017 1:15pm  Type of Therapy: Group Therapy- Balance in Life  Participation Level: Active   Description of the Group:  The topic for group was balance in life. Today's group focused on defining balance in one's own words, identifying things that can knock one off balance, and exploring healthy ways to maintain balance in life. Group members were asked to provide an example of a time when they felt off balance, describe how they handled that situation,and process healthier ways to regain balance in the future. Group members were asked to share the most important tool for maintaining balance that they learned [facility] and how they plan to apply this method after discharge.  Summary of Patient Progress Pt spoke about addiction being one of the main reasons that he is unable to maintain balance in his life. Pt reports that he has people in his life that he needs to cut out or draw boundaries with.   Therapeutic Modalities:   Cognitive Behavioral Therapy Solution-Focused Therapy Assertiveness Training  Louis JordanLynn B Tor Yang, MSW, [place] [phone] 01/06/2017 4:34 PM

## 2017-01-06 NOTE — Progress Notes (Signed)
[facility] MD Progress Note  01/06/2017 2:06 PM [employer].  MRN:  [id number] Subjective:  Patient states he is feeling better at this time but yesterday evening reports he felt increasingly fatigued, with chest discomfort,  and developed dyspnea on exertion. Currently breathing easily at room air, and states he feels much better. Reports some ongoing depression but states " I am all right". He remains future oriented, and is expressing interest in going to a rehab setting at this time , in order to decrease risk of relapse. Denies medication side effects.  Objective : I have discussed case with treatment team and have met with patient. Patient developed respiratory symptoms yesterday, resulting in his going to ED, work up revealed , as per Chest X Ray:  New patchy bibasilar opacities, right greater than left, may be atelectasis, pneumonia or aspiration.  At this time patient is doing better , and has no dyspnea at room air. He reports ongoing depression, but admits he is feeling better overall . Denies SI. No psychotic symptoms at this time. Denies medication side effects. He is interested in going to a rehab at discharge. No significant symptoms of alcohol withdrawal at this time- no tremors, no diaphoresis, no acute distress or discomfort.   Principal Problem: Depression, Alcohol, Cocaine Use Disorder  Diagnosis:   Patient Active Problem List   Diagnosis Date Noted  . MDD (major depressive disorder) [F32.9] 01/03/2017  . Essential hypertension [I10] 05/11/2016  . 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 Acute kidney injury ([place]) [N17.9] 05/06/2016  . Tobacco abuse [Z72.0] 05/06/2016  . Cocaine abuse [F14.10] 05/06/2016  . LVH (left ventricular hypertrophy) [I51.7] 05/06/2016  . Demand ischemia of myocardium ([place]) [I24.8] 05/06/2016  . Elevated troponin I level [R74.8] 05/06/2016  . Hyperlipidemia [E78.5] 05/06/2016  . Chronic left shoulder pain [M25.512, G89.29]  05/06/2016  . Chest pain [R07.9] 05/05/2016   Total Time spent with patient: 20 minutes Past Medical History:  Past Medical History:  Diagnosis Date  . Arthritis   . Hypertension   . MI (myocardial infarction) [facility])     Past Surgical History:  Procedure Laterality Date  . NECK SURGERY     Family History:  Family History  Problem Relation Age of Onset  . Hypertension Mother   . Hypertension Father   . Diabetes Father    Social History:  History  Alcohol Use  . Yes    Comment: over 1/5 of liquor, 6-7 40s, 4 bottles of wine per day     History  Drug Use  . Types: Cocaine    Comment: $800 worth in 4 days    Social History   Social History  . Marital status: Single    Spouse name: N/A  . Number of children: N/A  . Years of education: N/A   Social History Main Topics  . Smoking status: Current Every Day Smoker    Packs/day: 1.00    Types: Cigarettes  . Smokeless tobacco: Never Used  . Alcohol use Yes     Comment: over 1/5 of liquor, 6-7 40s, 4 bottles of wine per day  . Drug use: Yes    Types: Cocaine     Comment: $800 worth in 4 days  . Sexual activity: Yes   Other Topics Concern  . None   Social History Narrative  . None   Additional Social History:   Sleep: Good  Appetite:  Fair  Current Medications: Current Facility-Administered Medications  Medication Dose Route Frequency Provider Last  Rate Last Dose  . acetaminophen (TYLENOL) tablet 1,000 mg  1,000 mg Oral Q6H PRN Lindell Spar I, NP   1,000 mg at 01/06/17 1012  . albuterol (PROVENTIL HFA;VENTOLIN HFA) 108 (90 Base) MCG/ACT inhaler 2 puff  2 puff Inhalation Q4H PRN Cobos, Myer Peer, MD      . alum & mag hydroxide-simeth (MAALOX/MYLANTA) 200-200-20 MG/5ML suspension 30 mL  30 mL Oral Q4H PRN Okonkwo, [name], NP      . amLODipine (NORVASC) tablet 10 mg  10 mg Oral Daily Okonkwo, [name], NP   10 mg at 01/06/17 0740  . aspirin chewable tablet 81 mg  81 mg Oral Daily Okonkwo, [name], NP   81  mg at 01/06/17 0740  . [START ON 01/07/2017] azithromycin (ZITHROMAX) tablet 250 mg  250 mg Oral Daily Cobos, Fernando A, MD      . cloNIDine (CATAPRES) tablet 0.3 mg  0.3 mg Oral TID Lu Duffel, [name], NP   0.3 mg at 01/06/17 1213  . hydrOXYzine (ATARAX/VISTARIL) tablet 25 mg  25 mg Oral TID PRN Okonkwo, [name], NP      . ipratropium-albuterol (DUONEB) 0.5-2.5 (3) MG/3ML nebulizer solution 3 mL  3 mL Nebulization Q6H Cobos, Fernando A, MD      . loperamide (IMODIUM) capsule 2-4 mg  2-4 mg Oral PRN Cobos, Myer Peer, MD      . LORazepam (ATIVAN) tablet 1 mg  1 mg Oral Q6H PRN Cobos, Myer Peer, MD   1 mg at 01/05/17 2114  . magnesium hydroxide (MILK OF MAGNESIA) suspension 30 mL  30 mL Oral Daily PRN Okonkwo, [name], NP      . methocarbamol (ROBAXIN) tablet 500 mg  500 mg Oral Q6H PRN Lindell Spar I, NP   500 mg at 01/06/17 0408  . multivitamin with minerals tablet 1 tablet  1 tablet Oral Daily Cobos, Myer Peer, MD   1 tablet at 01/06/17 1013  . nicotine polacrilex (NICORETTE) gum 2 mg  2 mg Oral PRN Cobos, Myer Peer, MD   2 mg at 01/06/17 0829  . ondansetron (ZOFRAN-ODT) disintegrating tablet 4 mg  4 mg Oral Q6H PRN Cobos, Myer Peer, MD      . Derrill Memo ON 01/13/2017] predniSONE (DELTASONE) tablet 10 mg  10 mg Oral Q breakfast Cobos, Myer Peer, MD      . Derrill Memo ON 01/10/2017] predniSONE (DELTASONE) tablet 20 mg  20 mg Oral Q breakfast Cobos, Myer Peer, MD      . Derrill Memo ON 01/07/2017] predniSONE (DELTASONE) tablet 40 mg  40 mg Oral Q breakfast Cobos, Fernando A, MD      . thiamine (VITAMIN B-1) tablet 100 mg  100 mg Oral Daily Cobos, Myer Peer, MD   100 mg at 01/06/17 1013  . traZODone (DESYREL) tablet 25 mg  25 mg Oral QHS PRN Cobos, Myer Peer, MD        Lab Results:  Results for orders placed or performed during the hospital encounter of 01/03/17 (from the past 48 hour(s))  Basic metabolic panel     Status: Abnormal   Collection Time: 01/06/17  6:18 AM  Result Value Ref Range   Sodium  137 135 - 145 mmol/L   Potassium 5.4 (H) 3.5 - 5.1 mmol/L    Comment: HEMOLYSIS AT THIS LEVEL MAY AFFECT RESULT   Chloride 105 101 - 111 mmol/L   CO2 24 22 - 32 mmol/L   Glucose, Bld 93 65 - 99 mg/dL   BUN  25 (H) 6 - 20 mg/dL   Creatinine, Ser 2.07 (H) 0.61 - 1.24 mg/dL   Calcium 8.7 (L) 8.9 - 10.3 mg/dL   GFR calc non Af Amer 35 (L) >60 mL/min   GFR calc Af Amer 41 (L) >60 mL/min    Comment: (NOTE) The eGFR has been calculated using the CKD EPI equation. This calculation has not been validated in all clinical situations. eGFR's persistently <60 mL/min signify possible Chronic Kidney Disease.    Anion gap 8 5 - 15  CBC     Status: Abnormal   Collection Time: 01/06/17  6:18 AM  Result Value Ref Range   WBC 6.2 4.0 - 10.5 K/uL   RBC 4.01 (L) 4.22 - 5.81 MIL/uL   Hemoglobin 11.5 (L) 13.0 - 17.0 g/dL   HCT 34.8 (L) 39.0 - 52.0 %   MCV 86.8 78.0 - 100.0 fL   MCH 28.7 26.0 - 34.0 pg   MCHC 33.0 30.0 - 36.0 g/dL   RDW 16.7 (H) 11.5 - 15.5 %   Platelets 215 150 - 400 K/uL  Brain natriuretic peptide     Status: Abnormal   Collection Time: 01/06/17  6:58 AM  Result Value Ref Range   B Natriuretic Peptide 501.5 (H) 0.0 - 100.0 pg/mL  I-stat troponin, ED     Status: None   Collection Time: 01/06/17  7:00 AM  Result Value Ref Range   Troponin i, poc 0.02 0.00 - 0.08 ng/mL   Comment 3            Comment: Due to the release kinetics of cTnI, a negative result within the first hours of the onset of symptoms does not rule out myocardial infarction with certainty. If myocardial infarction is still suspected, repeat the test at appropriate intervals.     Blood Alcohol level:  No results found for: [facility]  Metabolic Disorder Labs: No results found for: HGBA1C, MPG No results found for: PROLACTIN No results found for: CHOL, TRIG, HDL, CHOLHDL, VLDL, LDLCALC  Physical Findings: AIMS: Facial and Oral Movements Muscles of Facial Expression: None, normal Lips and Perioral Area: None,  normal Jaw: None, normal Tongue: None, normal,Extremity Movements Upper (arms, wrists, hands, fingers): None, normal Lower (legs, knees, ankles, toes): None, normal, Trunk Movements Neck, shoulders, hips: None, normal, Overall Severity Severity of abnormal movements (highest score from questions above): None, normal Incapacitation due to abnormal movements: None, normal Patient's awareness of abnormal movements (rate only patient's report): No Awareness, Dental Status Current problems with teeth and/or dentures?: No Does patient usually wear dentures?: No  CIWA:  CIWA-Ar Total: 10 COWS:     Musculoskeletal: Strength & Muscle Tone: within normal limits- no tremors, no diaphoresis, no psychomotor restlessness or agitation Gait & Station: normal Patient leans: N/A  Psychiatric Specialty Exam: Physical Exam  ROS exertion dyspnea, chest pain, wheezing all now improved. Currently denies shortness of breath or chest pain, no fever or chills   Blood pressure (!) 143/90, pulse 70, temperature 97.6 F (36.4 C), temperature source Oral, resp. rate (!) 78, height 6' (1.829 m), weight 122.5 kg (270 lb), SpO2 98 %.Body mass index is 36.62 kg/m.  General Appearance: Fairly Groomed  Eye Contact:  Good  Speech:  Normal Rate  Volume:  Decreased  Mood:  remains depressed, but states feeling better than on admission  Affect:  mildly constricted, but reactive, smiles at times appropriately   Thought Process:  Goal Directed and Descriptions of Associations: Intact  Orientation:  Other:  fully alert and attentive n  Thought Content:  no psychotic symptoms   Suicidal Thoughts:  No denies suicidal or self injurious ideations, denies homicidal or violent ideations   Homicidal Thoughts:  No  Memory:  recent and remote grossly intact   Judgement:  Fair- improving   Insight:  improving   Psychomotor Activity:  no tremors, no diaphoresis, no restlessness or agitation  Concentration:  Concentration: Good  and Attention Span: Good  Recall:  Good  Fund of Knowledge:  Good  Language:  Good  Akathisia:  No  Handed:  Right  AIMS (if indicated):     Assets:  Communication Skills Desire for Improvement Resilience  ADL's:  Intact  Cognition:  WNL  Sleep:  Number of Hours: 6.5   Assessment - gradually improving mood and range of affect. At this time no significant symptoms of alcohol withdrawal. Developed acute respiratory symptoms last night,resulting in work up in ED, with abnormal Chest X Ray findings, possible pneumonia. Currently improved and no current SOB or fever. Remains future oriented , wanting to go to a Rehab setting at discharge Patient presents slightly depressed, and describes insomnia. Higher doses of Trazodone caused excessive sedation. He wants to try another medication for sleep, and agrees to REMERON trial, to address insomnia and depression.    Treatment Plan Summary: Treatment plan reviewed as below today 6/7.  Daily contact with patient to assess and evaluate symptoms and progress in treatment, Medication management, Plan inpatient admission and medications as below  Encourage group and milieu participation to work on coping skills and symptom reduction Continue Ativan PRNS for potential alcohol withdrawal symptoms D/C Trazodone  Start Remeron 7.5 mgrs QHS for depression and insomnia  Continue antihypertensive medication management Currently on Azithromycin course and on Deltasone course due to pulmonary findings  Treatment team working on disposition planning options- wants to go to rehab at discharge  Jenne Campus, MD 01/06/2017, 2:06 PMPatient ID: Louis Yang., male   DOB: [birth year], 51 y.o.   MRN: [id number]

## 2017-01-06 NOTE — ED Notes (Signed)
Patient transported to X-ray 

## 2017-01-06 NOTE — Discharge Instructions (Signed)
Take the prescribed medication as directed.  Can continue using albuterol inhaler when needed. Follow-up with your primary care doctor. Return to the ED for new or worsening symptoms.

## 2017-01-06 NOTE — ED Provider Notes (Signed)
[facility]-EMERGENCY DEPT Provider Note   CSN: [id number] Arrival date & time: 01/06/17  0551     History   Chief Complaint Chief Complaint  Patient presents with  . Chest Pain    HPI Louis Sheral FlowFlowers Jr. is a 51 y.o. male.  The history is provided by the patient and medical records.  Chest Pain   Associated symptoms include cough and shortness of breath.    51 y.o. M with hx of arthritis, HTN, hx of MI, presenting to the ED for chest pain and SOB.  Patient states he is mostly here for SOB.  Patient currently at [facility] for treatment for substance abuse.  States last night around bedtime (unsure exact time) but states he began feeling SOB.  States he tossed and turned for a while and woke up around 3-4am with worsening SOB.  States he feels like he cant catch his breath.   Has had a slight cough as well, non-productive.  No fever, chills, sweats.  States he did have some right sided chest pain last night but states it was a "mild twinge" in his right chest.  States this occurs occasionally when he sleeps on his side due to some chronic pain issues with both shoulders. States last night he was sleeping on his right side.  States pain is not worse with exertion.  No diaphoresis, nausea, vomiting.  No abdominal pain.  States he did have 4 BM's yesterday but thinks his stomach is just "out of it" from stopping drugs.  Patient has been a smoker since age 89, hasnt smoked in about 4 days though since he has been at [facility].  Hx of NSTEMI in 2017, no stents.  Past Medical History:  Diagnosis Date  . Arthritis   . Hypertension   . MI (myocardial infarction) [facility](HCC)     Patient Active Problem List   Diagnosis Date Noted  . MDD (major depressive disorder) 01/03/2017  . Essential hypertension 05/11/2016  . 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 Acute kidney injury (HCC) 05/06/2016  . Tobacco abuse 05/06/2016  . Cocaine abuse 05/06/2016  . LVH (left ventricular hypertrophy) 05/06/2016  . Demand  ischemia of myocardium (HCC) 05/06/2016  . Elevated troponin I level 05/06/2016  . Hyperlipidemia 05/06/2016  . Chronic left shoulder pain 05/06/2016  . Chest pain 05/05/2016    Past Surgical History:  Procedure Laterality Date  . NECK SURGERY         Home Medications    Prior to Admission medications   Medication Sig Start Date End Date Taking? Authorizing Provider  amLODipine (NORVASC) 10 MG tablet Take 1 tablet (10 mg total) by mouth daily. Patient not taking: Reported on 01/03/2017 12/04/16   Little, Ambrose Finlandachel Morgan, MD  aspirin (ASPIRIN CHILDRENS) 81 MG chewable tablet Chew 1 tablet (81 mg total) by mouth daily. Patient not taking: Reported on 01/03/2017 05/06/16   Dhungel, Theda BelfastNishant, MD  atorvastatin (LIPITOR) 20 MG tablet Take 1 tablet (20 mg total) by mouth daily. Patient not taking: Reported on 01/03/2017 12/04/16   Little, Ambrose Finlandachel Morgan, MD  cloNIDine (CATAPRES) 0.3 MG tablet Take 1 tablet (0.3 mg total) by mouth 3 (three) times daily. Patient not taking: Reported on 01/03/2017 12/04/16   Little, Ambrose Finlandachel Morgan, MD  hydrALAZINE (APRESOLINE) 100 MG tablet Take 1 tablet (100 mg total) by mouth 3 (three) times daily. Patient not taking: Reported on 01/03/2017 12/04/16   Little, Ambrose Finlandachel Morgan, MD  omeprazole (PRILOSEC) 20 MG capsule Take 1 capsule (20 mg total) by mouth  daily. 12/04/16 12/18/16  Little, Ambrose Finland, MD    Family History Family History  Problem Relation Age of Onset  . Hypertension Mother   . Hypertension Father   . Diabetes Father     Social History Social History  Substance Use Topics  . Smoking status: Current Every Day Smoker    Packs/day: 1.00    Types: Cigarettes  . Smokeless tobacco: Never Used  . Alcohol use Yes     Comment: over 1/5 of liquor, 6-7 40s, 4 bottles of wine per day     Allergies   Patient has no known allergies.   Review of Systems Review of Systems  Respiratory: Positive for cough, shortness of breath and wheezing.   Cardiovascular:  Positive for chest pain.  All other systems reviewed and are negative.    Physical Exam Updated Vital Signs BP (!) 181/137 (BP Location: Right Arm)   Pulse 77   Temp 97.6 F (36.4 C) (Oral)   Resp 19   Ht 6' (1.829 m)   Wt 122.5 kg (270 lb)   SpO2 99%   BMI 36.62 kg/m   Physical Exam  Constitutional: He is oriented to person, place, and time. He appears well-developed and well-nourished.  HENT:  Head: Normocephalic and atraumatic.  Mouth/Throat: Oropharynx is clear and moist.  Eyes: Conjunctivae and EOM are normal. Pupils are equal, round, and reactive to light.  Neck: Normal range of motion.  Cardiovascular: Normal rate, regular rhythm and normal heart sounds.   Pulmonary/Chest: Effort normal and breath sounds normal.  Dry cough on exam, expiratory wheezes throughout, no distress, able to speak in full sentences without difficulty  Abdominal: Soft. Bowel sounds are normal.  Musculoskeletal: Normal range of motion.  Neurological: He is alert and oriented to person, place, and time.  Skin: Skin is warm and dry.  Psychiatric: He has a normal mood and affect.  Nursing note and vitals reviewed.    ED Treatments / Results  Labs (all labs ordered are listed, but only abnormal results are displayed) Labs Reviewed  BASIC METABOLIC PANEL - Abnormal; Notable for the following:       Result Value   Potassium 5.4 (*)    BUN 25 (*)    Creatinine, Ser 2.07 (*)    Calcium 8.7 (*)    GFR calc non Af Amer 35 (*)    GFR calc Af Amer 41 (*)    All other components within normal limits  CBC - Abnormal; Notable for the following:    RBC 4.01 (*)    Hemoglobin 11.5 (*)    HCT 34.8 (*)    RDW 16.7 (*)    All other components within normal limits  BRAIN NATRIURETIC PEPTIDE - Abnormal; Notable for the following:    B Natriuretic Peptide 501.5 (*)    All other components within normal limits  BASIC METABOLIC PANEL  I-STAT TROPOININ, ED    EKG  EKG Interpretation None        Radiology Dg Chest 2 View  Result Date: 01/06/2017 CLINICAL DATA:  Chest pain and shortness of breath. EXAM: CHEST  2 VIEW COMPARISON:  Radiographs 3 days prior 01/03/2017, as well as 05/11/2016 FINDINGS: Development of patchy opacities and both lower lung zones, right greater than left. Trace fluid in the fissures, no subpulmonic effusion. Borderline cardiomegaly, with mild increase from prior exam. Mild vascular congestion without overt edema. No pneumothorax. No acute osseous abnormality. IMPRESSION: Vascular congestion with mild increase size of the cardiac silhouette  from exam 3 days prior, suggesting fluid overload/ early CHF. New patchy bibasilar opacities, right greater than left, may be atelectasis, pneumonia or aspiration. Electronically Signed   By: Rubye Oaks M.D.   On: 01/06/2017 06:55    Procedures Procedures (including critical care time)  ED ECG REPORT   Date: 01/06/2017  Rate: 74  Rhythm: normal sinus rhythm  QRS Axis: normal  Intervals: normal  ST/T Wave abnormalities: nonspecific T wave changes  Conduction Disutrbances:none  Narrative Interpretation:   Old EKG Reviewed: unchanged  TWI inferior and lateral leads, unchanged from previous tracing.  I have personally reviewed the EKG tracing and agree with the computerized printout as noted.   Medications Ordered in ED Medications  alum & mag hydroxide-simeth (MAALOX/MYLANTA) 200-200-20 MG/5ML suspension 30 mL (not administered)  hydrOXYzine (ATARAX/VISTARIL) tablet 25 mg (not administered)  magnesium hydroxide (MILK OF MAGNESIA) suspension 30 mL (not administered)  amLODipine (NORVASC) tablet 10 mg (10 mg Oral Given 01/06/17 0740)  aspirin chewable tablet 81 mg (81 mg Oral Given 01/06/17 0740)  cloNIDine (CATAPRES) tablet 0.3 mg (0.3 mg Oral Given 01/06/17 0740)  nicotine polacrilex (NICORETTE) gum 2 mg (2 mg Oral Given 01/06/17 0829)  acetaminophen (TYLENOL) tablet 1,000 mg (1,000 mg Oral Given 01/06/17 1012)   methocarbamol (ROBAXIN) tablet 500 mg (500 mg Oral Given 01/06/17 0408)  thiamine (VITAMIN B-1) tablet 100 mg (100 mg Oral Given 01/06/17 1013)  multivitamin with minerals tablet 1 tablet (1 tablet Oral Given 01/06/17 1013)  LORazepam (ATIVAN) tablet 1 mg (1 mg Oral Given 01/05/17 2114)  loperamide (IMODIUM) capsule 2-4 mg (not administered)  ondansetron (ZOFRAN-ODT) disintegrating tablet 4 mg (not administered)  traZODone (DESYREL) tablet 25 mg (not administered)  azithromycin (ZITHROMAX) tablet 250 mg (not administered)  predniSONE (DELTASONE) tablet 40 mg (not administered)  predniSONE (DELTASONE) tablet 20 mg (not administered)  predniSONE (DELTASONE) tablet 10 mg (not administered)  azithromycin (ZITHROMAX) tablet 500 mg (not administered)  ipratropium-albuterol (DUONEB) 0.5-2.5 (3) MG/3ML nebulizer solution 3 mL (not administered)  albuterol (PROVENTIL HFA;VENTOLIN HFA) 108 (90 Base) MCG/ACT inhaler 2 puff (not administered)  potassium chloride SA (K-DUR,KLOR-CON) CR tablet 40 mEq (40 mEq Oral Given 01/04/17 2105)  albuterol (PROVENTIL) (2.5 MG/3ML) 0.083% nebulizer solution 5 mg (5 mg Nebulization Given 01/06/17 0740)  ipratropium (ATROVENT) nebulizer solution 0.5 mg (0.5 mg Nebulization Given 01/06/17 0740)  predniSONE (DELTASONE) tablet 60 mg (60 mg Oral Given 01/06/17 0739)  albuterol (PROVENTIL HFA;VENTOLIN HFA) 108 (90 Base) MCG/ACT inhaler 2 puff (2 puffs Inhalation Given 01/06/17 0843)     Initial Impression / Assessment and Plan / ED Course  I have reviewed the triage vital signs and the nursing notes.  Pertinent labs & imaging results that were available during my care of the patient were reviewed by me and considered in my medical decision making (see chart for details).  51 year old male here with chief complaint of shortness of breath. Began last night. Also noted some mild right-sided chest pain, but attributed this to sleeping on his right shoulder. On exam he is afebrile and  nontoxic. He is in no acute distress. He does have diffuse expiratory wheezes throughout. Has had a dry cough as well. Smoker for 35+ years. Will plan for labs, chest x-ray. Nebs and prednisone ordered.  EKG does have some T-wave inversions in inferior and lateral leads, however unchanged from prior tracing.  Lab work as above-- K+ 5.4, hemolysis noted.  No peaked T-waves noted on EKG.  BNP elevated at 500. SrCr appears at  baseline compared with prior.  Mild vascular congestion and findings concern for right sided CAP found on his CXR.  Patient has had recent cough and this may account for some of his right sided chest pain earlier.  Wheezing has resolved after neb treatment.  BP also improved after dose of home meds.  Symptoms today seem more related to CAP/COPD than CHF.  He does not appear clinically fluid overloaded here.  Troponin negative.  Vitals remain stable.  Patient states he is feeling better and is ready to go back to [facility].  Feel this is reasonable.  Will start prednisone taper, azithromycin.  Given albuterol inhaler here and instructed on use.  Will have him follow-up with his PCP once discharged from [facility].  Discussed plan with patient, he acknowledged understanding and agreed with plan of care.  Return precautions given for new or worsening symptoms.  Final Clinical Impressions(s) / ED Diagnoses   Final diagnoses:  Cough  Wheezing  Shortness of breath    New Prescriptions Current Discharge Medication List    START taking these medications   Details  azithromycin (ZITHROMAX) 250 MG tablet Take 1 tablet (250 mg total) by mouth daily. Take first 2 tablets together, then 1 every day until finished. Qty: 6 tablet, Refills: 0    predniSONE (DELTASONE) 20 MG tablet Take 40 mg by mouth daily for 3 days, then 20mg  by mouth daily for 3 days, then 10mg  daily for 3 days Qty: 12 tablet, Refills: 0         Garlon Hatchet, PA-C 01/06/17 1137    Geoffery Lyons, MD 01/06/17 [phone]

## 2017-01-07 DIAGNOSIS — I10 Essential (primary) hypertension: Secondary | ICD-10-CM

## 2017-01-07 MED ORDER — HYDRALAZINE HCL 25 MG PO TABS
25.0000 mg | ORAL_TABLET | Freq: Three times a day (TID) | ORAL | Status: DC
  Administered 2017-01-07 – 2017-01-10 (×9): 25 mg via ORAL
  Filled 2017-01-07 (×2): qty 21
  Filled 2017-01-07: qty 1
  Filled 2017-01-07: qty 21
  Filled 2017-01-07 (×4): qty 1
  Filled 2017-01-07: qty 21
  Filled 2017-01-07 (×7): qty 1
  Filled 2017-01-07 (×2): qty 21
  Filled 2017-01-07: qty 1

## 2017-01-07 NOTE — Progress Notes (Signed)
[facility] MD Progress Note  01/07/2017 12:20 PM Louis Gershman Jr.  MRN:  [id number] Subjective:  Patient states his mood is partially better, and that he feels he is better able to " relax". He reports , however, ongoing insomnia and some lingering depression. Denies suicidal ideations. Remains motivated in going to a rehab setting after discharge. At this time denies chest pain or dyspnea at room air .  Objective : I have discussed case with treatment team and have met with patient. Patient presents partially improved. Affect is more reactive than admission. Denies suicidal ideations. Behavior is calm, in good control, and he is visible on unit, although interactions with peers is limited . Denies alcohol withdrawal symptoms, and does not appear to be in any acute distress or discomfort. As noted in prior note, he had recent episode of dyspnea and chest discomfort , was worked up in ED, and started on Antibiotic and Prednisone. Respiratory symptoms are improving .  Of note, however, BP remains elevated, in spite of high dose Clonidine, along with Amlodipine .  I have discussed case with hospitalist , Dr. Charlies Silvers, and recommendation is to add Hydralazyine to current medications .    Principal Problem: Depression, Alcohol, Cocaine Use Disorder  Diagnosis:   Patient Active Problem List   Diagnosis Date Noted  . MDD (major depressive disorder) [F32.9] 01/03/2017  . Essential hypertension [I10] 05/11/2016  . 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 Acute kidney injury ([place]) [N17.9] 05/06/2016  . Tobacco abuse [Z72.0] 05/06/2016  . Cocaine abuse [F14.10] 05/06/2016  . LVH (left ventricular hypertrophy) [I51.7] 05/06/2016  . Demand ischemia of myocardium ([place]) [I24.8] 05/06/2016  . Elevated troponin I level [R74.8] 05/06/2016  . Hyperlipidemia [E78.5] 05/06/2016  . Chronic left shoulder pain [M25.512, G89.29] 05/06/2016  . Chest pain [R07.9] 05/05/2016   Total Time spent with patient:  20 minutes Past Medical History:  Past Medical History:  Diagnosis Date  . Arthritis   . Hypertension   . MI (myocardial infarction) [facility])     Past Surgical History:  Procedure Laterality Date  . NECK SURGERY     Family History:  Family History  Problem Relation Age of Onset  . Hypertension Mother   . Hypertension Father   . Diabetes Father    Social History:  History  Alcohol Use  . Yes    Comment: over 1/5 of liquor, 6-7 40s, 4 bottles of wine per day     History  Drug Use  . Types: Cocaine    Comment: $800 worth in 4 days    Social History   Social History  . Marital status: Single    Spouse name: N/A  . Number of children: N/A  . Years of education: N/A   Social History Main Topics  . Smoking status: Current Every Day Smoker    Packs/day: 1.00    Types: Cigarettes  . Smokeless tobacco: Never Used  . Alcohol use Yes     Comment: over 1/5 of liquor, 6-7 40s, 4 bottles of wine per day  . Drug use: Yes    Types: Cocaine     Comment: $800 worth in 4 days  . Sexual activity: Yes   Other Topics Concern  . None   Social History Narrative  . None   Additional Social History:   Sleep: Fair- patient states he slept fairly last night, but does say he feels more rested and relaxed today  Appetite:  Fair  Current Medications: Current Facility-Administered Medications  Medication Dose Route Frequency Provider Last Rate Last Dose  . acetaminophen (TYLENOL) tablet 1,000 mg  1,000 mg Oral Q6H PRN Lindell Spar I, NP   1,000 mg at 01/06/17 2120  . albuterol (PROVENTIL HFA;VENTOLIN HFA) 108 (90 Base) MCG/ACT inhaler 2 puff  2 puff Inhalation Q4H PRN Cobos, Myer Peer, MD   2 puff at 01/07/17 1203  . alum & mag hydroxide-simeth (MAALOX/MYLANTA) 200-200-20 MG/5ML suspension 30 mL  30 mL Oral Q4H PRN Okonkwo, [name], NP      . amLODipine (NORVASC) tablet 10 mg  10 mg Oral Daily Okonkwo, [name], NP   10 mg at 01/07/17 0745  . aspirin chewable tablet 81 mg  81 mg  Oral Daily Okonkwo, [name], NP   81 mg at 01/07/17 0745  . azithromycin (ZITHROMAX) tablet 250 mg  250 mg Oral Daily Cobos, Myer Peer, MD   250 mg at 01/07/17 0745  . cloNIDine (CATAPRES) tablet 0.3 mg  0.3 mg Oral TID Lu Duffel, [name], NP   0.3 mg at 01/07/17 1200  . hydrOXYzine (ATARAX/VISTARIL) tablet 25 mg  25 mg Oral TID PRN Lu Duffel, [name], NP   25 mg at 01/06/17 2120  . ipratropium-albuterol (DUONEB) 0.5-2.5 (3) MG/3ML nebulizer solution 3 mL  3 mL Nebulization Q6H Cobos, Myer Peer, MD   3 mL at 01/07/17 0853  . loperamide (IMODIUM) capsule 2-4 mg  2-4 mg Oral PRN Cobos, Myer Peer, MD      . LORazepam (ATIVAN) tablet 1 mg  1 mg Oral Q6H PRN Cobos, Myer Peer, MD   1 mg at 01/05/17 2114  . magnesium hydroxide (MILK OF MAGNESIA) suspension 30 mL  30 mL Oral Daily PRN Okonkwo, [name], NP      . methocarbamol (ROBAXIN) tablet 500 mg  500 mg Oral Q6H PRN Lindell Spar I, NP   500 mg at 01/06/17 2120  . mirtazapine (REMERON) tablet 7.5 mg  7.5 mg Oral QHS Cobos, Myer Peer, MD   7.5 mg at 01/06/17 2120  . multivitamin with minerals tablet 1 tablet  1 tablet Oral Daily Cobos, Myer Peer, MD   1 tablet at 01/07/17 0745  . nicotine polacrilex (NICORETTE) gum 2 mg  2 mg Oral PRN Cobos, Myer Peer, MD   2 mg at 01/06/17 0829  . ondansetron (ZOFRAN-ODT) disintegrating tablet 4 mg  4 mg Oral Q6H PRN Cobos, Myer Peer, MD      . Derrill Memo ON 01/13/2017] predniSONE (DELTASONE) tablet 10 mg  10 mg Oral Q breakfast Cobos, Myer Peer, MD      . Derrill Memo ON 01/10/2017] predniSONE (DELTASONE) tablet 20 mg  20 mg Oral Q breakfast Cobos, Fernando A, MD      . predniSONE (DELTASONE) tablet 40 mg  40 mg Oral Q breakfast Cobos, Myer Peer, MD   40 mg at 01/07/17 0744  . thiamine (VITAMIN B-1) tablet 100 mg  100 mg Oral Daily Cobos, Myer Peer, MD   100 mg at 01/07/17 0745    Lab Results:  Results for orders placed or performed during the hospital encounter of 01/03/17 (from the past 48 hour(s))  Basic  metabolic panel     Status: Abnormal   Collection Time: 01/06/17  6:18 AM  Result Value Ref Range   Sodium 137 135 - 145 mmol/L   Potassium 5.4 (H) 3.5 - 5.1 mmol/L    Comment: HEMOLYSIS AT THIS LEVEL MAY AFFECT RESULT   Chloride 105 101 - 111 mmol/L   CO2  24 22 - 32 mmol/L   Glucose, Bld 93 65 - 99 mg/dL   BUN 25 (H) 6 - 20 mg/dL   Creatinine, Ser 1.67 (H) 0.61 - 1.24 mg/dL   Calcium 8.7 (L) 8.9 - 10.3 mg/dL   GFR calc non Af Amer 35 (L) >60 mL/min   GFR calc Af Amer 41 (L) >60 mL/min    Comment: (NOTE) The eGFR has been calculated using the CKD EPI equation. This calculation has not been validated in all clinical situations. eGFR's persistently <60 mL/min signify possible Chronic Kidney Disease.    Anion gap 8 5 - 15  CBC     Status: Abnormal   Collection Time: 01/06/17  6:18 AM  Result Value Ref Range   WBC 6.2 4.0 - 10.5 K/uL   RBC 4.01 (L) 4.22 - 5.81 MIL/uL   Hemoglobin 11.5 (L) 13.0 - 17.0 g/dL   HCT 77.3 (L) 17.9 - 15.2 %   MCV 86.8 78.0 - 100.0 fL   MCH 28.7 26.0 - 34.0 pg   MCHC 33.0 30.0 - 36.0 g/dL   RDW 48.4 (H) 94.8 - 35.5 %   Platelets 215 150 - 400 K/uL  Brain natriuretic peptide     Status: Abnormal   Collection Time: 01/06/17  6:58 AM  Result Value Ref Range   B Natriuretic Peptide 501.5 (H) 0.0 - 100.0 pg/mL  I-stat troponin, ED     Status: None   Collection Time: 01/06/17  7:00 AM  Result Value Ref Range   Troponin i, poc 0.02 0.00 - 0.08 ng/mL   Comment 3            Comment: Due to the release kinetics of cTnI, a negative result within the first hours of the onset of symptoms does not rule out myocardial infarction with certainty. If myocardial infarction is still suspected, repeat the test at appropriate intervals.   Basic metabolic panel     Status: Abnormal   Collection Time: 01/06/17  6:19 PM  Result Value Ref Range   Sodium 140 135 - 145 mmol/L   Potassium 4.5 3.5 - 5.1 mmol/L   Chloride 107 101 - 111 mmol/L   CO2 25 22 - 32 mmol/L    Glucose, Bld 207 (H) 65 - 99 mg/dL   BUN 27 (H) 6 - 20 mg/dL   Creatinine, Ser 9.97 (H) 0.61 - 1.24 mg/dL   Calcium 9.4 8.9 - 68.2 mg/dL   GFR calc non Af Amer 33 (L) >60 mL/min   GFR calc Af Amer 38 (L) >60 mL/min    Comment: (NOTE) The eGFR has been calculated using the CKD EPI equation. This calculation has not been validated in all clinical situations. eGFR's persistently <60 mL/min signify possible Chronic Kidney Disease.    Anion gap 8 5 - 15    Comment: Performed at [facility], [address], [place], [place] [address]    Blood Alcohol level:  No results found for: [facility]  Metabolic Disorder Labs: No results found for: HGBA1C, MPG No results found for: PROLACTIN No results found for: CHOL, TRIG, HDL, CHOLHDL, VLDL, LDLCALC  Physical Findings: AIMS: Facial and Oral Movements Muscles of Facial Expression: None, normal Lips and Perioral Area: None, normal Jaw: None, normal Tongue: None, normal,Extremity Movements Upper (arms, wrists, hands, fingers): None, normal Lower (legs, knees, ankles, toes): None, normal, Trunk Movements Neck, shoulders, hips: None, normal, Overall Severity Severity of abnormal movements (highest score from questions above): None, normal Incapacitation due  to abnormal movements: None, normal Patient's awareness of abnormal movements (rate only patient's report): No Awareness, Dental Status Current problems with teeth and/or dentures?: No Does patient usually wear dentures?: No  CIWA:  CIWA-Ar Total: 10 COWS:     Musculoskeletal: Strength & Muscle Tone: within normal limits- no tremors or diaphoresis Gait & Station: normal Patient leans: N/A  Psychiatric Specialty Exam: Physical Exam  ROS  At this time denies chest pain or dyspnea, describes some vague abdominal discomfort, nausea, but no vomiting . No fever, no chills .   Blood pressure (!) 150/94, pulse 71, temperature 98.6 F (37 C), resp. rate 20, height 6' (1.829 m),  weight 122.5 kg (270 lb), SpO2 98 %.Body mass index is 36.62 kg/m.  General Appearance: improving grooming compared to admission  Eye Contact:  Good  Speech:  Normal Rate  Volume:  Normal  Mood:  improving gradually, less depressed   Affect:  remains vaguely constricted and dysphoric,but improving overall   Thought Process:  Linear and Descriptions of Associations: Intact  Orientation:  Full (Time, Place, and Person)  Thought Content:  denies hallucinations, no delusions, not internally preoccupied   Suicidal Thoughts:  No denies any active suicidal or self injurious ideations, denies homicidal ideations  Homicidal Thoughts:  No  Memory:  recent and remote grossly intact   Judgement:  Other:  improving - improving   Insight:  improving   Psychomotor Activity:  no tremors, no diaphoresis, no restlessness or agitation  Concentration:  Concentration: Good and Attention Span: Good  Recall:  Good  Fund of Knowledge:  Good  Language:  Good  Akathisia:  Negative  Handed:  Right  AIMS (if indicated):     Assets:  Communication Skills Desire for Improvement Resilience  ADL's:  Intact  Cognition:  WNL  Sleep:  Number of Hours: 6.5   Assessment - patient is not currently presenting with significant symptoms of alcohol withdrawal. His mood is improving , affect is more reactive . He reports poor sleep, but chart notes indicate he slept better and he does state he feels more relaxed this AM. Denies medication side effects, is tolerating Remeron well . He is now on Augmentin and on Prednisone course for respiratory symptoms, currently not dyspneic , not febrile. His BP remains elevated in spite of current medication regimen.     Treatment Plan Summary: Treatment plan reviewed as below today 6/8   Daily contact with patient to assess and evaluate symptoms and progress in treatment, Medication management, Plan inpatient admission and medications as below  Encourage group and milieu  participation to work on coping skills and symptom reduction Continue Ativan PRNS for potential alcohol withdrawal symptoms Continue Remeron 7.5 mgrs QHS for depression and insomnia  Have discussed case with Dr. Charlies Silvers , Hospitalist - we have reviewed labs, including elevated B Natriuretic Peptide and most recent Echocardiogram results . Recommendation is to augment antihypertensive treatment with Hydralazine 25 mgrs Q 8 hours, and continue Amlodipine and Clonidine at current doses . Currently on Azithromycin course and on Deltasone course due to pulmonary findings  Treatment team working on disposition planning options- continues to be motivated in going rehab at discharge  Jenne Campus, MD 01/07/2017, 12:20 PM   Patient ID: Louis Frame., male   DOB: 27-Apr-1966, 51 y.o.   MRN: [id number]

## 2017-01-07 NOTE — [facility] Group Notes (Signed)
[facility] LCSW Group Therapy 01/07/2017 1:15pm  Type of Therapy: Group Therapy- Feelings Around Relapse and Recovery  Participation Level: Active   Participation Quality:  Appropriate  Affect:  Appropriate  Cognitive: Alert and Oriented   Insight:  Developing   Engagement in Therapy: Developing/Improving and Engaged   Modes of Intervention: Clarification, Confrontation, Discussion, Education, Exploration, Limit-setting, Orientation, Problem-solving, Rapport Building, Dance movement psychotherapisteality Testing, Socialization and Support  Summary of Progress/Problems: The topic for today was feelings about relapse. The group discussed what relapse prevention is to them and identified triggers that they are on the path to relapse. Members also processed their feeling towards relapse and were able to relate to common experiences. Group also discussed coping skills that can be used for relapse prevention. Pt reports that he has no problem with coming up with a relapse prevention plan. His main problem is being able to stick to that plan. Pt states that he has done this for so many years and he always goes back to using because he gets bored. "I feel like there's a big hole that I have to fill". CSW explained that pt would need to fill the time he spends using with another activity in order to not feel this way. Pt agreed but states that he hasn't found anything else to fill his time that really works.   Therapeutic Modalities:   Cognitive Behavioral Therapy Solution-Focused Therapy Assertiveness Training Relapse Prevention Therapy    Louis JordanLynn B Louis Yang, MSW, Theresia MajorsLCSWA [phone] 01/07/2017 3:24 PM

## 2017-01-07 NOTE — Progress Notes (Signed)
Spoke with Dr. Jama Flavorsobos, pt with uncontrolled hypertension. History of cocaine abuse so cannot get BB. Will add hydralazine to BP regimen and will follow up in am to see if better BP control achieved.  Louis Yang [phone]

## 2017-01-07 NOTE — Progress Notes (Signed)
Nursing Note 01/07/2017 [id number]  Data Reports sleeping fair.  Did not complete self-inventory.   Affect appropraite.  Denies HI, SI, AVH.  Patient somewhat irritable, standoffish this AM.  C/O nausea.   BP extremely high in AM.  Rechecked after morning meds but still very high, MD was notified.  F/U on pneumonia- taking scheduled breathing treatments, ABT, PRN albuterol throughout day.  Denies shortness of breath.  Denies chest pain.  Action Spoke with patient 1:1, nurse offered support to patient throughout shift.  MD contacted IM and received new orders for hydralizine scheduled..  Continues to be monitored on 15 minute checks for safety.  Response Remains safe and apporpriate.  BP came down in afternoon, but more elevated around dinner time.  MD made aware. IM to follow up on patient in the morning.

## 2017-01-07 NOTE — Progress Notes (Signed)
D   Pt said he was having a hard time knowing when he should come to get his medications since he had so many changes to his medication regiman today    He received his nebs treatment but he stopped half way through it and said it was making him feel worse and tight in his chest   He is irritable and anxious and has a hard time being still    He spent a lot of time on the phone this evening  A   Verbal support given   Medications administered and effectiveness monitored   Q 15 min checks   Reassured patient he would be monitored throughout the night should he have any breathing problems R   Pt is presently safe

## 2017-01-07 NOTE — Progress Notes (Signed)
Recreation Therapy Notes  Date: 01/07/17 Time: 0930 Location: [address] Dayroom  Group Topic: Stress Management  Goal Area(s) Addresses:  Patient will verbalize importance of using healthy stress management.  Patient will identify positive emotions associated with healthy stress management.   Intervention: Stress Management  Activity :  Guided Imagery.  LRT introduced the stress management technique of guided imagery.  LRT read a script to allow patients to take a mental vacation to escape their current surroundings.  Patients were to follow along as the script was read to fully engage in the activity.  Education:  Stress Management, Discharge Planning.   Education Outcome: Acknowledges edcuation/In group clarification offered/Needs additional education  Clinical Observations/Feedback: Pt did not attend group.   Shemekia Patane, LRT/CTRS         Ty Buntrock A 01/07/2017 11:23 AM 

## 2017-01-08 DIAGNOSIS — F1099 Alcohol use, unspecified with unspecified alcohol-induced disorder: Secondary | ICD-10-CM

## 2017-01-08 DIAGNOSIS — F1490 Cocaine use, unspecified, uncomplicated: Secondary | ICD-10-CM

## 2017-01-08 DIAGNOSIS — F17210 Nicotine dependence, cigarettes, uncomplicated: Secondary | ICD-10-CM

## 2017-01-08 MED ORDER — MIRTAZAPINE 15 MG PO TABS
15.0000 mg | ORAL_TABLET | Freq: Every day | ORAL | Status: DC
  Administered 2017-01-08 – 2017-01-09 (×2): 15 mg via ORAL
  Filled 2017-01-08: qty 7
  Filled 2017-01-08 (×2): qty 1
  Filled 2017-01-08: qty 7
  Filled 2017-01-08 (×2): qty 1

## 2017-01-08 NOTE — [facility] Group Notes (Signed)
[facility] Group Notes: (Clinical Social Work)   01/08/2017      Type of Therapy:  Group Therapy   Participation Level:  Did Not Attend despite MHT prompting - was in the group room at the beginning of group, but stated he wanted to process the anger the group was feeling about discharge processes not going as they felt it had been planned.  CSW informed him that we would not be using group therapy time for that purpose, and he left the room.   Ambrose MantleMareida Grossman-Orr, LCSW 01/08/2017, 3:31 PM

## 2017-01-08 NOTE — Progress Notes (Signed)
Louis Yang has done a good job today...engaged in his recovery as evidenced by his participation in the Life SKills group today, along with the work he has done this afternoon, after the group. A He completed his daily assessment and on this he wrote he denied SI today and he rated his depression, hopelessness and anxiety " 0/0/0/", respectively. R Safety iss maintained and BP is monitored.

## 2017-01-08 NOTE — Progress Notes (Cosign Needed)
Adult Psychoeducational Group Note  Date:  01/08/2017 Time:  12:21 AM  Group Topic/Focus:  Wrap-Up Group:   The focus of this group is to help patients review their daily goal of treatment and discuss progress on daily workbooks.  Participation Level:  Active  Participation Quality:  Appropriate and Attentive  Affect:  Appropriate  Cognitive:  Alert and Appropriate  Insight: Appropriate, Good and Improving  Engagement in Group:  Engaged  Modes of Intervention:  Discussion  Additional Comments:  Patient stated his goal for today was to his doctor about his discharge plan. Patient stated he felt refresh after the meeting with his doctor. Patient rated his day a 10 out of 10. Patient stated something positive that happen today was doctor found the right medication to aid with his hypertension. Patient stated tomorrow goal is to work on a placement for after discharge.  Louis FurnaceChristopher  [name] 01/08/2017, 12:21 AM

## 2017-01-08 NOTE — Social Work (Signed)
Clinical Social Work Note  Pt is now reporting that he does not want to go [facility].  He wants to leave the hospital on Monday and go straight home, plans to go to Narcotics Anonymous meetings, and refuses any other follow-up to be arranged by hospital staff.  Ambrose MantleMareida Grossman-Orr, LCSW 01/08/2017, 5:12 PM

## 2017-01-08 NOTE — Progress Notes (Signed)
D    Pt is compliant with treatment  And interacts appropriately with others   He is somewhat irritable at times    He complained of not sleeping well last nigh and received more sleep medication  He expresses that he is hoping for discharge tomorrow and he is ready to go A   Verbal support given   Medications administered and effectiveness monitored Q 15 min checks    Discussed discharge plans and what he could expect from the discharge process R   Pt is safe at present

## 2017-01-08 NOTE — Progress Notes (Signed)
[facility] MD Progress Note  01/08/2017 5:07 PM Louis [employer].  MRN:  [id number]   Subjective: Patient reports " I feel ready to go home, I came to the hospital because I needed help to detoxing from cocaine and alcohol . -Patient states " I just said that I was suicidal and depressed so that I could get help."  Objective :  Louis [employer]. is awake, alert and oriented. Patient is requesting to discharge today. Denies suicidal or homicidal ideation. Denies auditory or visual hallucination and does not appear to be responding to internal stimuli. Patient reports he is medication compliant without mediation side effects. Patient denies depression or depressive symptoms. Reports he is no longer interested in attending [facility].  Patient reportshe is not resting well at night. ( will adjust night time medications)Support, encouragement and reassurance was provided.    Principal Problem: Depression, Alcohol, Cocaine Use Disorder  Diagnosis:   Patient Active Problem List   Diagnosis Date Noted  . MDD (major depressive disorder) [F32.9] 01/03/2017  . Essential hypertension [I10] 05/11/2016  . 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 Acute kidney injury ([place]) [N17.9] 05/06/2016  . Tobacco abuse [Z72.0] 05/06/2016  . Cocaine abuse [F14.10] 05/06/2016  . LVH (left ventricular hypertrophy) [I51.7] 05/06/2016  . Demand ischemia of myocardium ([place]) [I24.8] 05/06/2016  . Elevated troponin I level [R74.8] 05/06/2016  . Hyperlipidemia [E78.5] 05/06/2016  . Chronic left shoulder pain [M25.512, G89.29] 05/06/2016  . Chest pain [R07.9] 05/05/2016   Total Time spent with patient: 20 minutes Past Medical History:  Past Medical History:  Diagnosis Date  . Arthritis   . Hypertension   . MI (myocardial infarction) [facility])     Past Surgical History:  Procedure Laterality Date  . NECK SURGERY     Family History:  Family History  Problem Relation Age of Onset  . Hypertension Mother   .  Hypertension Father   . Diabetes Father    Social History:  History  Alcohol Use  . Yes    Comment: over 1/5 of liquor, 6-7 40s, 4 bottles of wine per day     History  Drug Use  . Types: Cocaine    Comment: $800 worth in 4 days    Social History   Social History  . Marital status: Single    Spouse name: N/A  . Number of children: N/A  . Years of education: N/A   Social History Main Topics  . Smoking status: Current Every Day Smoker    Packs/day: 1.00    Types: Cigarettes  . Smokeless tobacco: Never Used  . Alcohol use Yes     Comment: over 1/5 of liquor, 6-7 40s, 4 bottles of wine per day  . Drug use: Yes    Types: Cocaine     Comment: $800 worth in 4 days  . Sexual activity: Yes   Other Topics Concern  . None   Social History Narrative  . None   Additional Social History:   Sleep: Fair- patient states he slept fairly last night, but does say he feels more rested and relaxed today  Appetite:  Fair  Current Medications: Current Facility-Administered Medications  Medication Dose Route Frequency Provider Last Rate Last Dose  . acetaminophen (TYLENOL) tablet 1,000 mg  1,000 mg Oral Q6H PRN Lindell Spar I, NP   1,000 mg at 01/06/17 2120  . albuterol (PROVENTIL HFA;VENTOLIN HFA) 108 (90 Base) MCG/ACT inhaler 2 puff  2 puff Inhalation Q4H PRN Cobos, Myer Peer, MD  2 puff at 01/08/17 0814  . alum & mag hydroxide-simeth (MAALOX/MYLANTA) 200-200-20 MG/5ML suspension 30 mL  30 mL Oral Q4H PRN Okonkwo, [name], NP      . amLODipine (NORVASC) tablet 10 mg  10 mg Oral Daily Okonkwo, [name], NP   10 mg at 01/08/17 0815  . aspirin chewable tablet 81 mg  81 mg Oral Daily Okonkwo, [name], NP   81 mg at 01/08/17 0815  . azithromycin (ZITHROMAX) tablet 250 mg  250 mg Oral Daily Cobos, Myer Peer, MD   250 mg at 01/08/17 0814  . cloNIDine (CATAPRES) tablet 0.3 mg  0.3 mg Oral TID Lu Duffel, [name], NP   0.3 mg at 01/08/17 1700  . hydrALAZINE (APRESOLINE) tablet 25 mg  25  mg Oral Q8H Cobos, Fernando A, MD   25 mg at 01/08/17 1522  . hydrOXYzine (ATARAX/VISTARIL) tablet 25 mg  25 mg Oral TID PRN Hughie Closs A, NP   25 mg at 01/07/17 2134  . ipratropium-albuterol (DUONEB) 0.5-2.5 (3) MG/3ML nebulizer solution 3 mL  3 mL Nebulization Q6H Cobos, Fernando A, MD   3 mL at 01/08/17 1400  . magnesium hydroxide (MILK OF MAGNESIA) suspension 30 mL  30 mL Oral Daily PRN Okonkwo, [name], NP      . methocarbamol (ROBAXIN) tablet 500 mg  500 mg Oral Q6H PRN Lindell Spar I, NP   500 mg at 01/07/17 2134  . mirtazapine (REMERON) tablet 15 mg  15 mg Oral QHS Derrill Center, NP      . multivitamin with minerals tablet 1 tablet  1 tablet Oral Daily Cobos, Myer Peer, MD   1 tablet at 01/08/17 0814  . nicotine polacrilex (NICORETTE) gum 2 mg  2 mg Oral PRN Cobos, Myer Peer, MD   2 mg at 01/06/17 0829  . [START ON 01/13/2017] predniSONE (DELTASONE) tablet 10 mg  10 mg Oral Q breakfast Cobos, Myer Peer, MD      . Derrill Memo ON 01/10/2017] predniSONE (DELTASONE) tablet 20 mg  20 mg Oral Q breakfast Cobos, Fernando A, MD      . predniSONE (DELTASONE) tablet 40 mg  40 mg Oral Q breakfast Cobos, Myer Peer, MD   40 mg at 01/08/17 0814  . thiamine (VITAMIN B-1) tablet 100 mg  100 mg Oral Daily Cobos, Myer Peer, MD   100 mg at 01/08/17 0815    Lab Results:  Results for orders placed or performed during the hospital encounter of 01/03/17 (from the past 48 hour(s))  Basic metabolic panel     Status: Abnormal   Collection Time: 01/06/17  6:19 PM  Result Value Ref Range   Sodium 140 135 - 145 mmol/L   Potassium 4.5 3.5 - 5.1 mmol/L   Chloride 107 101 - 111 mmol/L   CO2 25 22 - 32 mmol/L   Glucose, Bld 207 (H) 65 - 99 mg/dL   BUN 27 (H) 6 - 20 mg/dL   Creatinine, Ser 2.19 (H) 0.61 - 1.24 mg/dL   Calcium 9.4 8.9 - 10.3 mg/dL   GFR calc non Af Amer 33 (L) >60 mL/min   GFR calc Af Amer 38 (L) >60 mL/min    Comment: (NOTE) The eGFR has been calculated using the CKD EPI equation. This  calculation has not been validated in all clinical situations. eGFR's persistently <60 mL/min signify possible Chronic Kidney Disease.    Anion gap 8 5 - 15    Comment: Performed at [facility],  [place] [address] [place], [place] [address]    Blood Alcohol level:  No results found for: [facility]  Metabolic Disorder Labs: No results found for: HGBA1C, MPG No results found for: PROLACTIN No results found for: CHOL, TRIG, HDL, CHOLHDL, VLDL, LDLCALC  Physical Findings: AIMS: Facial and Oral Movements Muscles of Facial Expression: None, normal Lips and Perioral Area: None, normal Jaw: None, normal Tongue: None, normal,Extremity Movements Upper (arms, wrists, hands, fingers): None, normal Lower (legs, knees, ankles, toes): None, normal, Trunk Movements Neck, shoulders, hips: None, normal, Overall Severity Severity of abnormal movements (highest score from questions above): None, normal Incapacitation due to abnormal movements: None, normal Patient's awareness of abnormal movements (rate only patient's report): No Awareness, Dental Status Current problems with teeth and/or dentures?: No Does patient usually wear dentures?: No  CIWA:  CIWA-Ar Total: 10 COWS:     Musculoskeletal: Strength & Muscle Tone: within normal limits- no tremors or diaphoresis Gait & Station: normal Patient leans: N/A  Psychiatric Specialty Exam: Physical Exam  Review of Systems  Cardiovascular: Negative for chest pain and palpitations.  Gastrointestinal: Negative for heartburn.  Neurological: Negative for dizziness and headaches.  Psychiatric/Behavioral: Positive for substance abuse. Negative for depression, hallucinations and suicidal ideas. The patient is nervous/anxious and has insomnia.     Blood pressure (!) 154/95, pulse 73, temperature 97.7 F (36.5 C), temperature source Oral, resp. rate 20, height 6' (1.829 m), weight 122.5 kg (270 lb), SpO2 98 %.Body mass index is 36.62 kg/m.   General Appearance: improving grooming compared to admission  Eye Contact:  Good  Speech:  Normal Rate  Volume:  Normal  Mood:  Anxious and Depressed  Affect:  remains vaguely constricted and dysphoric,but improving overall   Thought Process:  Linear  Orientation:  Full (Time, Place, and Person)  Thought Content:  denies hallucinations, no delusions, not internally preoccupied   Suicidal Thoughts:  No   Homicidal Thoughts:  No  Memory:  recent and remote grossly intact   Judgement:  Fair  Insight:  improving   Psychomotor Activity:  Normal  Concentration:  Concentration: Good and Attention Span: Good  Recall:  Good  Fund of Knowledge:  Good  Language:  Good  Akathisia:  Negative  Handed:  Right  AIMS (if indicated):     Assets:  Communication Skills Desire for Improvement Resilience  ADL's:  Intact  Cognition:  WNL  Sleep:  Number of Hours: 6.5     I agree with current treatment plan on 01/08/2017, Patient seen face-to-face for psychiatric evaluation follow-up, chart reviewed. Reviewed the information documented and agree with the treatment plan.   Treatment Plan Summary:  Treatment plan reviewed and continue  today 01/08/2017 excepted where noted   Daily contact with patient to assess and evaluate symptoms and progress in treatment, Medication management, Plan inpatient admission and medications as below  Encourage group and milieu participation to work on coping skills and symptom reduction Continue Ativan PRNS for potential alcohol withdrawal symptoms Increased Remeron 7.5 mgrs to 15 mg QHS for depression and insomnia  Have discussed case with Dr. Charlies Silvers , Hospitalist - we have reviewed labs, including elevated B Natriuretic Peptide and most recent Echocardiogram results . Recommendation is to augment antihypertensive treatment with Hydralazine 25 mgrs Q 8 hours, and continue Amlodipine and Clonidine at current doses . Currently on Azithromycin course and on Deltasone  course due to pulmonary findings  Treatment team working on disposition planning options- continues to be motivated in going rehab at discharge  Derrill Center, NP 01/08/2017, 5:07 PM

## 2017-01-08 NOTE — [facility] Group Notes (Signed)
Identifying Needs   Date:  01/08/2017  Time:  1100  Type of Therapy:  Nurse Education / identifying Needs : The group focuses on teaching patietns how to identify their needs as well as develop skills needed to get them met.  Participation Level:  Active  Participation Quality:  Attentive  Affect:  Appropriate  Cognitive:  Alert  Insight:  Good  Engagement in Group:  Engaged  Modes of Intervention:  Education  Summary of Progress/Problems:  Lauralyn Primes 01/08/2017, 1:13 PM

## 2017-01-08 NOTE — Progress Notes (Signed)
D    Pt is compliant with treatment  And interacts appropriately with others   He is somewhat irritable at times    He complained of not sleeping well last night but did not tell anyone about it last night   He expresses that he is hoping for discharge tomorrow and he is ready to go A   Verbal support given   Medications administered and effectiveness monitored Q 15 min checks    Discussed discharge plans and what he could expect from the discharge process R   Pt is safe at present

## 2017-01-09 NOTE — Progress Notes (Signed)
Patient ID: Louis Erikssonoscoe Arrick Jr., male   DOB: 08/06/1965, 51 y.o.   MRN: [id number]  Pt currently presents with an inconsistent affect and agitated behavior. Pt speech is loud, sarcastic in nature. Passive aggressive statements from patient indicated aggression towards staff. Dominating interaction. Pt plan is to go home tomorrow. Pt reports good sleep with current medication regimen. Reports pain 6/10 tonight. Chooses not to attend group tonight.   Pt provided with medications per providers orders. Pt's labs and vitals were monitored throughout the night. Pt given a 1:1 about emotional and mental status. Pt supported and encouraged to express concerns and questions. Pt educated on medications.  Pt's safety ensured with 15 minute and environmental checks. Pt currently denies SI/HI and A/V hallucinations. Pt verbally agrees to seek staff if SI/HI or A/VH occurs and to consult with staff before acting on any harmful thoughts.  Will continue POC.

## 2017-01-09 NOTE — [facility] Group Notes (Signed)
[facility] LCSW Group Therapy  01/09/2017   Type of Therapy:  Group Therapy  Participation Level:  Did not Attend  Neizan Debruhl J Ayumi Wangerin MSW, LCSW 

## 2017-01-09 NOTE — Progress Notes (Signed)
[facility] MD Progress Note  01/09/2017 4:42 PM Teng Badley Jr.  MRN:  [id number] Subjective:  51 yo AAM lives with his girlfriend. Self presented to the ER seeking detox from cocaine. He was scheduled to get into rehab tomorrow. Changed his mind and now wants to go back home Says he expressed suicidal thoughts as that was the only way to get detox. Tells me that he is not suicidal or homicidal. Says he is in good spirits. He hopes to get back home tomorrow. No evidence of depression. No evidence of psychosis. No evidence of mania. Denies any cravings for substances. No thoughts of violence.  Nursing staff reports that patient has been appropriate on the unit. Patient has been interacting well with peers. No behavioral issues. Patient has not voiced any suicidal thoughts. Patient has not been observed to be internally stimulated. Patient has been adherent with treatment recommendations. Patient has been tolerating their medication well.   Principal Problem: <principal problem not specified> Diagnosis:   Patient Active Problem List   Diagnosis Date Noted  . MDD (major depressive disorder) [F32.9] 01/03/2017  . Essential hypertension [I10] 05/11/2016  . 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 Acute kidney injury ([facility]) [N17.9] 05/06/2016  . Tobacco abuse [Z72.0] 05/06/2016  . Cocaine abuse [F14.10] 05/06/2016  . LVH (left ventricular hypertrophy) [I51.7] 05/06/2016  . Demand ischemia of myocardium ([facility]) [I24.8] 05/06/2016  . Elevated troponin I level [R74.8] 05/06/2016  . Hyperlipidemia [E78.5] 05/06/2016  . Chronic left shoulder pain [M25.512, G89.29] 05/06/2016  . Chest pain [R07.9] 05/05/2016   Total Time spent with patient: 20 minutes  Past Psychiatric History: As in H&P  Past Medical History:  Past Medical History:  Diagnosis Date  . Arthritis   . Hypertension   . MI (myocardial infarction) [facility])     Past Surgical History:  Procedure Laterality Date  . NECK SURGERY      Family History:  Family History  Problem Relation Age of Onset  . Hypertension Mother   . Hypertension Father   . Diabetes Father    Family Psychiatric  History: As in H&P Social History:  History  Alcohol Use  . Yes    Comment: over 1/5 of liquor, 6-7 40s, 4 bottles of wine per day     History  Drug Use  . Types: Cocaine    Comment: $800 worth in 4 days    Social History   Social History  . Marital status: Single    Spouse name: N/A  . Number of children: N/A  . Years of education: N/A   Social History Main Topics  . Smoking status: Current Every Day Smoker    Packs/day: 1.00    Types: Cigarettes  . Smokeless tobacco: Never Used  . Alcohol use Yes     Comment: over 1/5 of liquor, 6-7 40s, 4 bottles of wine per day  . Drug use: Yes    Types: Cocaine     Comment: $800 worth in 4 days  . Sexual activity: Yes   Other Topics Concern  . None   Social History Narrative  . None   Additional Social History:          Sleep: Good  Appetite:  Good  Current Medications: Current Facility-Administered Medications  Medication Dose Route Frequency Provider Last Rate Last Dose  . acetaminophen (TYLENOL) tablet 1,000 mg  1,000 mg Oral Q6H PRN Armandina Stammer I, NP   1,000 mg at 01/08/17 2145  . albuterol (PROVENTIL HFA;VENTOLIN HFA)  108 (90 Base) MCG/ACT inhaler 2 puff  2 puff Inhalation Q4H PRN Cobos, Rockey Situ, MD   2 puff at 01/09/17 0737  . alum & mag hydroxide-simeth (MAALOX/MYLANTA) 200-200-20 MG/5ML suspension 30 mL  30 mL Oral Q4H PRN Okonkwo, [name], NP      . amLODipine (NORVASC) tablet 10 mg  10 mg Oral Daily Okonkwo, [name], NP   10 mg at 01/09/17 1449  . aspirin chewable tablet 81 mg  81 mg Oral Daily Okonkwo, [name], NP   81 mg at 01/09/17 0738  . azithromycin (ZITHROMAX) tablet 250 mg  250 mg Oral Daily Cobos, Rockey Situ, MD   250 mg at 01/09/17 0738  . cloNIDine (CATAPRES) tablet 0.3 mg  0.3 mg Oral TID Beryle Lathe, [name], NP   0.3 mg at  01/09/17 1629  . hydrALAZINE (APRESOLINE) tablet 25 mg  25 mg Oral Q8H Cobos, Fernando A, MD   25 mg at 01/09/17 1439  . hydrOXYzine (ATARAX/VISTARIL) tablet 25 mg  25 mg Oral TID PRN Ferne Reus A, NP   25 mg at 01/08/17 2145  . ipratropium-albuterol (DUONEB) 0.5-2.5 (3) MG/3ML nebulizer solution 3 mL  3 mL Nebulization Q6H Cobos, Fernando A, MD   3 mL at 01/09/17 1440  . magnesium hydroxide (MILK OF MAGNESIA) suspension 30 mL  30 mL Oral Daily PRN Okonkwo, [name], NP      . methocarbamol (ROBAXIN) tablet 500 mg  500 mg Oral Q6H PRN Armandina Stammer I, NP   500 mg at 01/08/17 2145  . mirtazapine (REMERON) tablet 15 mg  15 mg Oral QHS Oneta Rack, NP   15 mg at 01/08/17 2145  . multivitamin with minerals tablet 1 tablet  1 tablet Oral Daily Cobos, Rockey Situ, MD   1 tablet at 01/09/17 0738  . nicotine polacrilex (NICORETTE) gum 2 mg  2 mg Oral PRN Cobos, Rockey Situ, MD   2 mg at 01/06/17 0829  . [START ON 01/13/2017] predniSONE (DELTASONE) tablet 10 mg  10 mg Oral Q breakfast Cobos, Rockey Situ, MD      . Melene Muller ON 01/10/2017] predniSONE (DELTASONE) tablet 20 mg  20 mg Oral Q breakfast Cobos, Fernando A, MD      . thiamine (VITAMIN B-1) tablet 100 mg  100 mg Oral Daily Cobos, Rockey Situ, MD   100 mg at 01/09/17 1610    Lab Results: No results found for this or any previous visit (from the past 48 hour(s)).  Blood Alcohol level:  No results found for: [facility]  Metabolic Disorder Labs: No results found for: HGBA1C, MPG No results found for: PROLACTIN No results found for: CHOL, TRIG, HDL, CHOLHDL, VLDL, LDLCALC  Physical Findings: AIMS: Facial and Oral Movements Muscles of Facial Expression: None, normal Lips and Perioral Area: None, normal Jaw: None, normal Tongue: None, normal,Extremity Movements Upper (arms, wrists, hands, fingers): None, normal Lower (legs, knees, ankles, toes): None, normal, Trunk Movements Neck, shoulders, hips: None, normal, Overall Severity Severity of  abnormal movements (highest score from questions above): None, normal Incapacitation due to abnormal movements: None, normal Patient's awareness of abnormal movements (rate only patient's report): No Awareness, Dental Status Current problems with teeth and/or dentures?: No Does patient usually wear dentures?: No  CIWA:  CIWA-Ar Total: 10 COWS:     Musculoskeletal: Strength & Muscle Tone: within normal limits Gait & Station: normal Patient leans: N/A  Psychiatric Specialty Exam: Physical Exam  Constitutional: He is oriented to person, place, and time. No  distress.  HENT:  Head: Normocephalic and atraumatic.  Eyes: Conjunctivae are normal. Pupils are equal, round, and reactive to light.  Neck: Normal range of motion. Neck supple.  Cardiovascular: Normal rate.   Respiratory: Effort normal.  Neurological: He is alert and oriented to person, place, and time.  Skin: Skin is warm and dry. He is not diaphoretic.  Psychiatric:  As above    ROS  Blood pressure (!) 149/96, pulse 75, temperature 98.4 F (36.9 C), resp. rate 16, height 6' (1.829 m), weight 122.5 kg (270 lb), SpO2 98 %.Body mass index is 36.62 kg/m.  General Appearance: Neatly dressed, pleasant, engaging well and cooperative. Appropriate behavior. Not in any distress. Good relatedness. Not internally stimulated  Eye Contact:  Good  Speech:  Clear and Coherent and Normal Rate  Volume:  Normal  Mood:  Euthymic  Affect:  Appropriate and Full Range  Thought Process:  Linear  Orientation:  Full (Time, Place, and Person)  Thought Content:  No delusional theme. No preoccupation with violent thoughts. No negative ruminations. No obsession.  No hallucination in any modality.   Suicidal Thoughts:  No  Homicidal Thoughts:  No  Memory:  Immediate;   Good Recent;   Good Remote;   Good  Judgement:  Good  Insight:  Fair  Psychomotor Activity:  Normal  Concentration:  Concentration: Good and Attention Span: Good  Recall:  Good   Fund of Knowledge:  Good  Language:  Good  Akathisia:  Negative  Handed:    AIMS (if indicated):     Assets:  Communication Skills Desire for Improvement Housing Intimacy Physical Health Vocational/Educational  ADL's:  Intact  Cognition:  WNL  Sleep:  Number of Hours: 6.5     Treatment Plan Summary: Patient is at baseline. No evidence of withdrawals, psychosis, depression or mania. He is not a danger to himself or others. He is scheduled for discharge tomorrow. Would continue current medications.  Georgiann CockerVincent A Izediuno, MD 01/09/2017, 4:42 PM

## 2017-01-09 NOTE — Progress Notes (Addendum)
D Louis Yang takes his meds at 0800 this am. He is agitated, again, because ( he says) " I should've gone home yesterday when Dr. Jama Flavorsobos told me I was going to". He stated " you know I don't do that paper" when writer asked him to complete his daily assessment, Writer sat with pt and asked questions, writing down patient's responses ver batum , pt walked off. Pt denies SI today and says he doesn't have " any depression, anxiety and /or hopelessness.R Pt remains safe.

## 2017-01-09 NOTE — Progress Notes (Signed)
Adult Psychoeducational Group Note  Date:  01/09/2017 Time:  12:13 AM  Group Topic/Focus:  Wrap-Up Group:   The focus of this group is to help patients review their daily goal of treatment and discuss progress on daily workbooks.  Participation Level:  Did Not Attend  Participation Quality:  Patient did not attend  Affect:  Patient did not attend  Cognitive:  Patient did not attend  Insight: None  Engagement in Group:  Patient did not attend  Modes of Intervention:  Patient did not attend  Additional Comments:  Patient did not attend evening wrap up group.  Louis FurnaceChristopher  Gumecindo Yang 01/09/2017, 12:13 AM

## 2017-01-10 DIAGNOSIS — F1024 Alcohol dependence with alcohol-induced mood disorder: Secondary | ICD-10-CM

## 2017-01-10 MED ORDER — HYDROXYZINE HCL 25 MG PO TABS: 25.0000 mg | ORAL_TABLET | Freq: Three times a day (TID) | ORAL | 0 refills | Status: AC | PRN

## 2017-01-10 MED ORDER — NICOTINE POLACRILEX 2 MG MT GUM: 2.0000 mg | CHEWING_GUM | OROMUCOSAL | 0 refills | Status: AC | PRN

## 2017-01-10 MED ORDER — HYDRALAZINE HCL 25 MG PO TABS: 25.0000 mg | ORAL_TABLET | Freq: Three times a day (TID) | ORAL | 0 refills | Status: DC

## 2017-01-10 MED ORDER — PREDNISONE 20 MG PO TABS: ORAL_TABLET | ORAL | 0 refills | Status: AC

## 2017-01-10 MED ORDER — ALBUTEROL SULFATE HFA 108 (90 BASE) MCG/ACT IN AERS: 2.0000 | INHALATION_SPRAY | RESPIRATORY_TRACT | 0 refills | Status: AC | PRN

## 2017-01-10 MED ORDER — IPRATROPIUM-ALBUTEROL 0.5-2.5 (3) MG/3ML IN SOLN: 3.0000 mL | Freq: Four times a day (QID) | RESPIRATORY_TRACT | 0 refills | Status: AC

## 2017-01-10 MED ORDER — MIRTAZAPINE 15 MG PO TABS: 15.0000 mg | ORAL_TABLET | Freq: Every day | ORAL | 0 refills | Status: AC

## 2017-01-10 NOTE — Discharge Summary (Signed)
Physician Discharge Summary Note  Patient:  Louis Yang. is an 51 y.o., male MRN:  [id number] DOB:  03/23/1966 Patient phone:  [phone] (home)  Patient address:   [address] [place] [address],  Total Time spent with patient: 30 minutes  Date of Admission:  01/03/2017 Date of Discharge: 01/10/2017  Reason for Admission: Per HPI-  51 year old male. Presented to ER voluntarily . He states he has been drinking daily, heavily, particularly over the last two months. He also reports regular crack cocaine abuse . States " I am just so tired of using ". He states he was drinking up to 1/5th of liquor and several beers per day.Reports he has been feeling depressed. States he has had some passive SI, but currently denies any active suicidal ideations and is future oriented, wanting to go to a rehab at discharge   Principal Problem: MDD (major depressive disorder) Discharge Diagnoses: Patient Active Problem List   Diagnosis Date Noted  . MDD (major depressive disorder) [F32.9] 01/03/2017  . Essential hypertension [I10] 05/11/2016  . 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 Acute kidney injury (HCC) [N17.9] 05/06/2016  . Tobacco abuse [Z72.0] 05/06/2016  . Cocaine abuse [F14.10] 05/06/2016  . LVH (left ventricular hypertrophy) [I51.7] 05/06/2016  . Demand ischemia of myocardium (HCC) [I24.8] 05/06/2016  . Elevated troponin I level [R74.8] 05/06/2016  . Hyperlipidemia [E78.5] 05/06/2016  . Chronic left shoulder pain [M25.512, G89.29] 05/06/2016  . Chest pain [R07.9] 05/05/2016    Past Psychiatric History:  Past Medical History:  Past Medical History:  Diagnosis Date  . Arthritis   . Hypertension   . MI (myocardial infarction) [facility])     Past Surgical History:  Procedure Laterality Date  . NECK SURGERY     Family History:  Family History  Problem Relation Age of Onset  . Hypertension Mother   . Hypertension Father   . Diabetes Father    Family  Psychiatric  History: Social History:  History  Alcohol Use  . Yes    Comment: over 1/5 of liquor, 6-7 40s, 4 bottles of wine per day     History  Drug Use  . Types: Cocaine    Comment: $800 worth in 4 days    Social History   Social History  . Marital status: Single    Spouse name: N/A  . Number of children: N/A  . Years of education: N/A   Social History Main Topics  . Smoking status: Current Every Day Smoker    Packs/day: 1.00    Types: Cigarettes  . Smokeless tobacco: Never Used  . Alcohol use Yes     Comment: over 1/5 of liquor, 6-7 40s, 4 bottles of wine per day  . Drug use: Yes    Types: Cocaine     Comment: $800 worth in 4 days  . Sexual activity: Yes   Other Topics Concern  . None   Social History Narrative  . None    Hospital Course:  Louis Yang. was admitted for MDD (major depressive disorder)  with psychosis and crisis management.  Pt was treated discharged with the medications listed below under Medication List.  Medical problems were identified and treated as needed.  Home medications were restarted as appropriate.  Improvement was monitored by observation and Louis Yang. 's daily report of symptom reduction.  Emotional and mental status was monitored by daily self-inventory reports completed by Louis Yang. and clinical staff.  Louis Yang. was evaluated by the treatment team for stability and plans for continued recovery upon discharge. Louis Stepien Jr. 's motivation was an integral factor for scheduling further treatment. Employment, transportation, bed availability, health status, family support, and any pending legal issues were also considered during hospital stay. Pt was offered further treatment options upon discharge including but not limited to Residential, Intensive Outpatient, and Outpatient treatment.  Louis Loews Yang. will follow up with the services as listed below under Follow Up Information.     Upon  completion of this admission the patient was both mentally and medically stable for discharge denying suicidal/homicidal ideation, auditory/visual/tactile hallucinations, delusional thoughts and paranoia.    Louis Loews Yang. responded well to treatment with Remeron 150mg   and ETOH protocol without adverse effects. Pt demonstrated improvement without reported or observed adverse effects to the point of stability appropriate for outpatient management. Pertinent labs include: BMP, BNP, CBC and CMPfor which outpatient follow-up is necessary for lab recheck as mentioned below. Reviewed CBC, CMP, BAL, and UDS; all unremarkable aside from noted exceptions.   Physical Findings: AIMS: Facial and Oral Movements Muscles of Facial Expression: None, normal Lips and Perioral Area: None, normal Jaw: None, normal Tongue: None, normal,Extremity Movements Upper (arms, wrists, hands, fingers): None, normal Lower (legs, knees, ankles, toes): None, normal, Trunk Movements Neck, shoulders, hips: None, normal, Overall Severity Severity of abnormal movements (highest score from questions above): None, normal Incapacitation due to abnormal movements: None, normal Patient's awareness of abnormal movements (rate only patient's report): No Awareness, Dental Status Current problems with teeth and/or dentures?: No Does patient usually wear dentures?: No  CIWA:  CIWA-Ar Total: 10 COWS:     Musculoskeletal: Strength & Muscle Tone: within normal limits Gait & Station: normal Patient leans: N/A  Psychiatric Specialty Exam: See SRA BY MD Physical Exam  Nursing note and vitals reviewed. Constitutional: He is oriented to person, place, and time. He appears well-developed.  Cardiovascular: Normal rate.   Neurological: He is alert and oriented to person, place, and time.  Psychiatric: He has a normal mood and affect. His behavior is normal.    Review of Systems  Neurological: Negative for dizziness, tingling and  headaches.  Psychiatric/Behavioral: Negative for depression (stable) and hallucinations.    Blood pressure (!) 176/107, pulse 71, temperature 98.5 F (36.9 C), temperature source Oral, resp. rate 20, height 6' (1.829 m), weight 122.5 kg (270 lb), SpO2 98 %.Body mass index is 36.62 kg/m.    Have you used any form of tobacco in the last 30 days? (Cigarettes, Smokeless Tobacco, Cigars, and/or Pipes): Yes  Has this patient used any form of tobacco in the last 30 days? (Cigarettes, Smokeless Tobacco, Cigars, and/or Pipes) Yes, Yes, A prescription for an FDA-approved tobacco cessation medication was offered at discharge and the patient refused  Blood Alcohol level:  No results found for: [facility]  Metabolic Disorder Labs:  No results found for: HGBA1C, MPG No results found for: PROLACTIN No results found for: CHOL, TRIG, HDL, CHOLHDL, VLDL, LDLCALC  See Psychiatric Specialty Exam and Suicide Risk Assessment completed by Attending Physician prior to discharge.  Discharge destination:  Home  Is patient on multiple antipsychotic therapies at discharge:  No   Has Patient had three or more failed trials of antipsychotic monotherapy by history:  No  Recommended Plan for Multiple Antipsychotic Therapies: NA  Discharge Instructions    Diet - low sodium heart healthy    Complete by:  As directed    Discharge  instructions    Complete by:  As directed    Take all medications as prescribed. Keep all follow-up appointments as scheduled.  Do not consume alcohol or use illegal drugs while on prescription medications. Report any adverse effects from your medications to your primary care provider promptly.  In the event of recurrent symptoms or worsening symptoms, call 911, a crisis hotline, or go to the nearest emergency department for evaluation.   Increase activity slowly    Complete by:  As directed      Allergies as of 01/10/2017   No Known Allergies     Medication List    STOP taking these  medications   omeprazole 20 MG capsule Commonly known as:  PRILOSEC     TAKE these medications     Indication  albuterol 108 (90 Base) MCG/ACT inhaler Commonly known as:  PROVENTIL HFA;VENTOLIN HFA Inhale 2 puffs into the lungs every 4 (four) hours as needed for wheezing or shortness of breath.  Indication:  Asthma, Chronic Obstructive Lung Disease   amLODipine 10 MG tablet Commonly known as:  NORVASC Take 1 tablet (10 mg total) by mouth daily.  Indication:  High Blood Pressure Disorder   aspirin 81 MG chewable tablet Commonly known as:  ASPIRIN CHILDRENS Chew 1 tablet (81 mg total) by mouth daily.  Indication:  Acute Heart Attack   atorvastatin 20 MG tablet Commonly known as:  LIPITOR Take 1 tablet (20 mg total) by mouth daily.  Indication:  High Amount of Fats in the Blood   azithromycin 250 MG tablet Commonly known as:  ZITHROMAX Take 1 tablet (250 mg total) by mouth daily. Take first 2 tablets together, then 1 every day until finished.    cloNIDine 0.3 MG tablet Commonly known as:  CATAPRES Take 1 tablet (0.3 mg total) by mouth 3 (three) times daily.  Indication:  High Blood Pressure Disorder   hydrALAZINE 25 MG tablet Commonly known as:  APRESOLINE Take 1 tablet (25 mg total) by mouth every 8 (eight) hours. What changed:  medication strength  how much to take  when to take this  Indication:  High Blood Pressure Disorder   hydrOXYzine 25 MG tablet Commonly known as:  ATARAX/VISTARIL Take 1 tablet (25 mg total) by mouth 3 (three) times daily as needed for anxiety.  Indication:  Anxiety Neurosis   ipratropium-albuterol 0.5-2.5 (3) MG/3ML Soln Commonly known as:  DUONEB Take 3 mLs by nebulization every 6 (six) hours.  Indication:  Spasm of Lung Air Passages   mirtazapine 15 MG tablet Commonly known as:  REMERON Take 1 tablet (15 mg total) by mouth at bedtime.  Indication:  Major Depressive Disorder   nicotine polacrilex 2 MG gum Commonly known as:   NICORETTE Take 1 each (2 mg total) by mouth as needed for smoking cessation.  Indication:  Nicotine Addiction   predniSONE 20 MG tablet Commonly known as:  DELTASONE Take 40 mg by mouth daily for 3 days, then 20mg  by mouth daily for 3 days, then 10mg  daily for 3 days       Follow-up Information    Patient refuses follow-up to be arranged. Follow up.   Why:  Patient states that he intends to go to NA or AA meetings at discharge.  He was encouraged to plan to attend 90 meetings in 90 days.          Follow-up recommendations:  Activity:  as tolerated Diet:  heart healthy Other:  [facility] for [facility]N managment  Comments:  Take all medications as prescribed. Keep all follow-up appointments as scheduled.  Do not consume alcohol or use illegal drugs while on prescription medications. Report any adverse effects from your medications to your primary care provider promptly.  In the event of recurrent symptoms or worsening symptoms, call 911, a crisis hotline, or go to the nearest emergency department for evaluation.   Signed: Oneta Rackanika N Lewis, NP 01/10/2017, 10:55 AM   Patient seen, Suicide Assessment Completed.  Disposition Plan Reviewed

## 2017-01-10 NOTE — Progress Notes (Signed)
Recreation Therapy Notes  Date: 01/10/17 Time: 0930 Location: 300 Hall Group Room  Group Topic: Stress Management  Goal Area(s) Addresses:  Patient will verbalize importance of using healthy stress management.  Patient will identify positive emotions associated with healthy stress management.   Behavioral Response: Engaged  Intervention: Stress Management  Activity :  Meditation.  LRT introduced the stress management technique of meditation.  LRT played a meditation from the Calm app to allow patients to participate in meditating.  Patients were to follow along with the meditation to fully engage in the technique.  Education:  Stress Management, Discharge Planning.   Education Outcome: Acknowledges edcuation/In group clarification offered/Needs additional education  Clinical Observations/Feedback: Pt did not attend group.   Caroll RancherMarjette Johanny Segers, LRT/CTRS         Caroll RancherLindsay, Nikitta Sobiech A 01/10/2017 12:39 PM

## 2017-01-10 NOTE — Progress Notes (Signed)
Pt discharged to lobby. Pt was stable and appreciative at that time. All papers, samples and prescriptions were given and valuables returned. Verbal understanding expressed. Denies SI/HI and A/VH. Pt given opportunity to express concerns and ask questions.  

## 2017-01-10 NOTE — [facility] Suicide Risk Assessment (Signed)
[facility]   Principal Problem: MDD (major depressive disorder) Discharge Diagnoses:  Patient Active Problem List   Diagnosis Date Noted  . MDD (major depressive disorder) [F32.9] 01/03/2017  . Essential hypertension [I10] 05/11/2016  . 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 Acute kidney injury (HCC) [N17.9] 05/06/2016  . Tobacco abuse [Z72.0] 05/06/2016  . Cocaine abuse [F14.10] 05/06/2016  . LVH (left ventricular hypertrophy) [I51.7] 05/06/2016  . Demand ischemia of myocardium (HCC) [I24.8] 05/06/2016  . Elevated troponin I level [R74.8] 05/06/2016  . Hyperlipidemia [E78.5] 05/06/2016  . Chronic left shoulder pain [M25.512, G89.29] 05/06/2016  . Chest pain [R07.9] 05/05/2016    Total Time spent with patient: 30 minutes  Musculoskeletal: Strength & Muscle Tone: within normal limits Gait & Station: normal Patient leans: N/A  Psychiatric Specialty Exam: ROS today denies chest pain, no shortness of breath, no headache.   Blood pressure (!) 176/107, pulse 71, temperature 98.5 F (36.9 C), temperature source Oral, resp. rate 20, height 6' (1.829 m), weight 122.5 kg (270 lb), SpO2 98 %.Body mass index is 36.62 kg/m.  General Appearance: grooming has improved   Eye Contact::  Good  Speech:  Normal Rate409  Volume:  Normal  Mood:  feels better, denies depression at this time   Affect:  more reactive, smiles at times appropriately, not irritable   Thought Process:  Linear and Descriptions of Associations: Intact  Orientation:  Other:  oriented x 3   Thought Content:  denies hallucinations, no delusions, not internally preoccupied  Suicidal Thoughts:  No denies suicidal ideations, no self injurious ideations, no homicidal or violent ideations   Homicidal Thoughts:  No  Memory:  recent and remote grossly intact   Judgement:  Other:  improving   Insight:  improving   Psychomotor Activity:  Normal  Concentration:  Good  Recall:  Good   Fund of Knowledge:Good  Language: Good  Akathisia:  Negative  Handed:  Right  AIMS (if indicated):     Assets:  Desire for Improvement Resilience  Sleep:  Number of Hours: 6  Cognition: WNL  ADL's:  Intact   Mental Status Per Nursing Assessment::   On Admission:     Demographic Factors:  51 year old male , lives with GF, has one adult daughter, employed   Loss Factors: Chronic medical illnesses, substance abuse   Historical Factors: No prior psychiatric admissions, history of cocaine abuse, history of substance induced hallucinations  Risk Reduction Factors:   Sense of responsibility to family and Positive coping skills or problem solving skills  Continued Clinical Symptoms:  At this time patient is alert and attentive, well related, pleasant, mood improved, affect fuller in range, presents euthymic, no thought disorder, no suicidal or self injurious ideations, no homicidal or violent ideations, no psychotic symptoms, future oriented . Staff reports patient can be verbally loud and irritable at times. At this time calm, pleasant, behavior in good control, visible in day room. Denies medication side effects. We have discussed medication side effects, have discussed the importance of sobriety, and the likelihood that cocaine use will negatively affect, aggravate his HTN. We discussed the importance of proper PCP follow up for HTN management and monitoring of renal function- he expresses understanding I have reviewed current labs and vitals with hospitalist consultant- no contraindication for discharge- patient to continue current antihypertensive medications.   Cognitive Features That Contribute To Risk:  No gross cognitive deficits noted upon discharge. Is alert , attentive, and oriented x 3  Suicide Risk:  Mild  Follow-up Information    Patient refuses follow-up to be arranged. Follow up.   Why:  Patient states that he intends to go to NA or AA meetings at discharge.  He  was encouraged to plan to attend 90 meetings in 90 days.          Plan Of Care/Follow-up recommendations:  Activity:  as tolerated  Diet:  heart healthy Tests:  NA Other:  See below Patient is requesting discharge- no grounds for involuntary commitment at this time He plans to return home. He has declined follow up but states he plans to attend NA regularly. [facility] for [facility] management- instructed to come to ED immediately if symptoms such as chest pain, neurologic deficits, or dyspnea ensue .  Craige Cotta, MD 01/10/2017, 11:41 AM

## 2017-01-10 NOTE — Progress Notes (Signed)
  [facility] :  Will you be returning to the same living situation after discharge:  Yes,  return home w significant other At discharge, do you have transportation home?: Yes,  significant other Do you have the ability to pay for your medications: No.patient uninsured, will be referred to PCP and clinic who can assist w medical needs  Release of information consent forms completed and in the chart;  Patient's signature needed at discharge.  Patient to Follow up at: Follow-up Information    Patient refuses follow-up to be arranged. Follow up.   Why:  Patient states that he intends to go to NA or AA meetings at discharge.  He was encouraged to plan to attend 90 meetings in 90 days.          Next level [facility] care provider [facility]no  Safety Planning and Suicide Prevention discussed: Yes,  discussed w patient in group, brochure provided, patient declined collateral contacgt  Have you used any form of tobacco in the last 30 days? (Cigarettes, Smokeless Tobacco, Cigars, and/or Pipes): Yes  Has patient been referred to the Quitline?: Patient refused referral  Patient has been referred for addiction treatment: Pt. refused referral; patient declined residential SA placement, perfers to follow up w AA/NA in community  Sallee Langenne C Cunningham 01/10/2017, 10:00 AM

## 2017-01-10 NOTE — [facility] Group Notes (Signed)
[facility]   Date/time:  01/10/2017 8:45 - 9:15 AM  Group Description:  Discharge planning group reviews patient's anticipated discharge plans and assists patients to anticipate and address any barriers to wellness/recovery in the community.  Suicide prevention education is reviewed with patients in group.  Participation Quality:  appropriate  Plan for Discharge/Comments:  Declined residential treatment, states he has to get back to work because "its summer time and I have a lot to do"; states he has been in recovery locally for many years, knows resources, declined all outpatient follow up , plans to attend AA/NA  Transportation Means: significant other  Supports:  People he knows in recovery community, significant other  Santa GeneraAnne Filicia Scogin, LCSW Lead Clinical Social Worker Phone:  [phone]'

## 2017-02-10 ENCOUNTER — Encounter (HOSPITAL_COMMUNITY): Payer: Self-pay | Admitting: Emergency Medicine

## 2017-02-10 ENCOUNTER — Emergency Department (HOSPITAL_COMMUNITY): Payer: Medicaid - Out of State

## 2017-02-10 ENCOUNTER — Emergency Department (HOSPITAL_COMMUNITY)
Admission: EM | Admit: 2017-02-10 | Discharge: 2017-02-10 | Disposition: A | Discharge status: Home / Self Care | Payer: Medicaid - Out of State | Attending: Emergency Medicine | Admitting: Emergency Medicine

## 2017-02-10 DIAGNOSIS — I129 Hypertensive chronic kidney disease with stage 1 through stage 4 chronic kidney disease, or unspecified chronic kidney disease: Secondary | ICD-10-CM

## 2017-02-10 DIAGNOSIS — N181 Chronic kidney disease, stage 1: Secondary | ICD-10-CM | POA: Insufficient documentation

## 2017-02-10 DIAGNOSIS — R1084 Generalized abdominal pain: Secondary | ICD-10-CM

## 2017-02-10 DIAGNOSIS — I10 Essential (primary) hypertension: Secondary | ICD-10-CM

## 2017-02-10 DIAGNOSIS — K219 Gastro-esophageal reflux disease without esophagitis: Secondary | ICD-10-CM

## 2017-02-10 DIAGNOSIS — I252 Old myocardial infarction: Secondary | ICD-10-CM | POA: Insufficient documentation

## 2017-02-10 DIAGNOSIS — Z79899 Other long term (current) drug therapy: Secondary | ICD-10-CM | POA: Insufficient documentation

## 2017-02-10 DIAGNOSIS — F17210 Nicotine dependence, cigarettes, uncomplicated: Secondary | ICD-10-CM | POA: Insufficient documentation

## 2017-02-10 LAB — URINALYSIS, ROUTINE W REFLEX MICROSCOPIC
BACTERIA UA: NONE SEEN
BILIRUBIN URINE: NEGATIVE
Glucose, UA: NEGATIVE mg/dL
HGB URINE DIPSTICK: NEGATIVE
Ketones, ur: NEGATIVE mg/dL
LEUKOCYTES UA: NEGATIVE
NITRITE: NEGATIVE
Protein, ur: 100 mg/dL — AB
SPECIFIC GRAVITY, URINE: 1.026 (ref 1.005–1.030)
pH: 5 (ref 5.0–8.0)

## 2017-02-10 LAB — COMPREHENSIVE METABOLIC PANEL
ALK PHOS: 67 U/L (ref 38–126)
ALT: 20 U/L (ref 17–63)
ANION GAP: 11 (ref 5–15)
AST: 34 U/L (ref 15–41)
Albumin: 4 g/dL (ref 3.5–5.0)
BILIRUBIN TOTAL: 1 mg/dL (ref 0.3–1.2)
BUN: 23 mg/dL — ABNORMAL HIGH (ref 6–20)
CALCIUM: 9.3 mg/dL (ref 8.9–10.3)
CO2: 26 mmol/L (ref 22–32)
Chloride: 102 mmol/L (ref 101–111)
Creatinine, Ser: 2.32 mg/dL — ABNORMAL HIGH (ref 0.61–1.24)
GFR calc Af Amer: 36 mL/min — ABNORMAL LOW (ref >60)
GFR calc non Af Amer: 31 mL/min — ABNORMAL LOW (ref >60)
GLUCOSE: 149 mg/dL — AB (ref 65–99)
POTASSIUM: 3.5 mmol/L (ref 3.5–5.1)
Sodium: 139 mmol/L (ref 135–145)
TOTAL PROTEIN: 6.4 g/dL — AB (ref 6.5–8.1)

## 2017-02-10 LAB — CBC
HEMATOCRIT: 42.5 % (ref 39.0–52.0)
HEMOGLOBIN: 14.4 g/dL (ref 13.0–17.0)
MCH: 28.6 pg (ref 26.0–34.0)
MCHC: 33.9 g/dL (ref 30.0–36.0)
MCV: 84.3 fL (ref 78.0–100.0)
Platelets: 278 10*3/uL (ref 150–400)
RBC: 5.04 MIL/uL (ref 4.22–5.81)
RDW: 15.7 % — AB (ref 11.5–15.5)
WBC: 6.4 10*3/uL (ref 4.0–10.5)

## 2017-02-10 LAB — POC OCCULT BLOOD, ED: Fecal Occult Bld: NEGATIVE

## 2017-02-10 LAB — LIPASE, BLOOD: Lipase: 31 U/L (ref 11–51)

## 2017-02-10 MED ORDER — AMLODIPINE BESYLATE 10 MG PO TABS: 10.0000 mg | ORAL_TABLET | Freq: Every day | ORAL | 0 refills | Status: AC

## 2017-02-10 MED ORDER — OMEPRAZOLE 20 MG PO CPDR: 20.0000 mg | DELAYED_RELEASE_CAPSULE | Freq: Two times a day (BID) | ORAL | 0 refills | Status: AC

## 2017-02-10 MED ORDER — HYDRALAZINE HCL 25 MG PO TABS: 25.0000 mg | ORAL_TABLET | Freq: Three times a day (TID) | ORAL | 0 refills | Status: AC

## 2017-02-10 MED ORDER — HYDRALAZINE HCL 25 MG PO TABS
25.0000 mg | ORAL_TABLET | Freq: Once | ORAL | Status: AC
  Administered 2017-02-10: 25 mg via ORAL
  Filled 2017-02-10: qty 1

## 2017-02-10 MED ORDER — RANITIDINE HCL 150 MG PO TABS: 150.0000 mg | ORAL_TABLET | Freq: Two times a day (BID) | ORAL | 0 refills | Status: AC

## 2017-02-10 MED ORDER — ATORVASTATIN CALCIUM 20 MG PO TABS: 20.0000 mg | ORAL_TABLET | Freq: Every day | ORAL | 0 refills | Status: AC

## 2017-02-10 MED ORDER — GI COCKTAIL ~~LOC~~
30.0000 mL | Freq: Once | ORAL | Status: AC
  Administered 2017-02-10: 30 mL via ORAL
  Filled 2017-02-10: qty 30

## 2017-02-10 MED ORDER — CLONIDINE HCL 0.2 MG PO TABS
0.3000 mg | ORAL_TABLET | Freq: Once | ORAL | Status: AC
  Administered 2017-02-10: 0.3 mg via ORAL
  Filled 2017-02-10: qty 1

## 2017-02-10 MED ORDER — CLONIDINE HCL 0.3 MG PO TABS: 0.3000 mg | ORAL_TABLET | Freq: Three times a day (TID) | ORAL | 0 refills | Status: AC

## 2017-02-10 MED ORDER — AMLODIPINE BESYLATE 5 MG PO TABS
5.0000 mg | ORAL_TABLET | Freq: Once | ORAL | Status: AC
  Administered 2017-02-10: 5 mg via ORAL
  Filled 2017-02-10: qty 1

## 2017-02-10 NOTE — ED Notes (Addendum)
Patient returned to the room to receive medications & discharge papers. After receiving medications, stated "you don't need to go over those with me." Patient extremely rude and acted hateful. Very unpleasant.

## 2017-02-10 NOTE — ED Triage Notes (Signed)
Pt sts abd pain with some blood in stool; pt sts HA and cough; pt admits to crack use last night

## 2017-02-10 NOTE — ED Provider Notes (Signed)
[facility] DEPT Provider Note   CSN: [id number] Arrival date & time: 02/10/17  4098     History   Chief Complaint Chief Complaint  Patient presents with  . Abdominal Pain  . Cough    HPI Louis Yang. is a 51 y.o. male with history of poorly controlled hypertension, polysubstance abuse presents to the ED for evaluation of abdominal bloating for several months, gradually worsening over the last few days. Associated with LLQ/LUQ abdominal discomfort, nausea, increased gas. He reports seeing [facility] twice in the last week, has been straining more with BMs. No rectal pain. Abdominal bloating worse after eating and when laying on either side. States he will have a few bites of food a feel really full. He reports chronic cough, unchanged.   Patient drinks at least 40 oz of beer daily. Frequent crack cocaine use. Tobacco use. No h/o PUD, GI bleed, gastritis. Does not take omeprazole regularly for acid reflux. Denies fevers, chills, vomiting, melena, unexpected weight loss.   HPI  Past Medical History:  Diagnosis Date  . Arthritis   . Hypertension   . MI (myocardial infarction) [facility])     Patient Active Problem List   Diagnosis Date Noted  . Alcohol dependence with alcohol-induced mood disorder (HCC)   . MDD (major depressive disorder) 01/03/2017  . Essential hypertension 05/11/2016  . 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 Acute kidney injury (HCC) 05/06/2016  . Tobacco abuse 05/06/2016  . Cocaine abuse 05/06/2016  . LVH (left ventricular hypertrophy) 05/06/2016  . Demand ischemia of myocardium (HCC) 05/06/2016  . Elevated troponin I level 05/06/2016  . Hyperlipidemia 05/06/2016  . Chronic left shoulder pain 05/06/2016  . Chest pain 05/05/2016    Past Surgical History:  Procedure Laterality Date  . NECK SURGERY         Home Medications    Prior to Admission medications   Medication Sig Start Date End Date Taking? Authorizing Provider  albuterol  (PROVENTIL HFA;VENTOLIN HFA) 108 (90 Base) MCG/ACT inhaler Inhale 2 puffs into the lungs every 4 (four) hours as needed for wheezing or shortness of breath. 01/10/17   Oneta Rack, NP  amLODipine (NORVASC) 10 MG tablet Take 1 tablet (10 mg total) by mouth daily. 02/10/17   Liberty Handy, PA-C  aspirin (ASPIRIN CHILDRENS) 81 MG chewable tablet Chew 1 tablet (81 mg total) by mouth daily. Patient not taking: Reported on 01/03/2017 05/06/16   Dhungel, Theda Belfast, MD  atorvastatin (LIPITOR) 20 MG tablet Take 1 tablet (20 mg total) by mouth daily. 02/10/17   Liberty Handy, PA-C  azithromycin (ZITHROMAX) 250 MG tablet Take 1 tablet (250 mg total) by mouth daily. Take first 2 tablets together, then 1 every day until finished. 01/06/17   Garlon Hatchet, PA-C  cloNIDine (CATAPRES) 0.3 MG tablet Take 1 tablet (0.3 mg total) by mouth 3 (three) times daily. 02/10/17   Liberty Handy, PA-C  hydrALAZINE (APRESOLINE) 25 MG tablet Take 1 tablet (25 mg total) by mouth every 8 (eight) hours. 02/10/17   Liberty Handy, PA-C  hydrOXYzine (ATARAX/VISTARIL) 25 MG tablet Take 1 tablet (25 mg total) by mouth 3 (three) times daily as needed for anxiety. 01/10/17   Oneta Rack, NP  ipratropium-albuterol (DUONEB) 0.5-2.5 (3) MG/3ML SOLN Take 3 mLs by nebulization every 6 (six) hours. 01/10/17   Oneta Rack, NP  mirtazapine (REMERON) 15 MG tablet Take 1 tablet (15 mg total) by mouth at bedtime. 01/10/17   Oneta Rack, NP  nicotine polacrilex (NICORETTE) 2 MG gum Take 1 each (2 mg total) by mouth as needed for smoking cessation. 01/10/17   Oneta Rack, NP  omeprazole (PRILOSEC) 20 MG capsule Take 1 capsule (20 mg total) by mouth 2 (two) times daily before a meal. 02/10/17 02/25/17  Liberty Handy, PA-C  predniSONE (DELTASONE) 20 MG tablet Take 20 mg by mouth daily for 2 days, then 10mg  daily for 3 days 01/10/17   Cobos, Rockey Situ, MD  ranitidine (ZANTAC) 150 MG tablet Take 1 tablet (150 mg total) by mouth 2  (two) times daily. 02/10/17   Liberty Handy, PA-C    Family History Family History  Problem Relation Age of Onset  . Hypertension Mother   . Hypertension Father   . Diabetes Father     Social History Social History  Substance Use Topics  . Smoking status: Current Every Day Smoker    Packs/day: 1.00    Types: Cigarettes  . Smokeless tobacco: Never Used  . Alcohol use Yes     Comment: over 1/5 of liquor, 6-7 40s, 4 bottles of wine per day     Allergies   Patient has no known allergies.   Review of Systems Review of Systems  Constitutional: Negative for fever.  HENT: Negative for congestion and sore throat.   Respiratory: Negative for cough, chest tightness and shortness of breath.   Cardiovascular: Negative for chest pain and palpitations.  Gastrointestinal: Positive for abdominal pain, nausea and vomiting. Negative for constipation and diarrhea.  Genitourinary: Negative for difficulty urinating, dysuria, frequency and hematuria.  Musculoskeletal: Negative for back pain.  Skin: Negative for rash.  Neurological: Negative for syncope, weakness, numbness and headaches.     Physical Exam Updated Vital Signs BP (!) 169/116   Pulse 69   Temp 98.1 F (36.7 C) (Oral)   Resp 18   SpO2 99%   Physical Exam  Constitutional: He is oriented to person, place, and time. He appears well-developed and well-nourished. No distress.  HENT:  Head: Normocephalic and atraumatic.  Nose: Nose normal.  Mouth/Throat: Oropharynx is clear and moist. No oropharyngeal exudate.  Eyes: Pupils are equal, round, and reactive to light. Conjunctivae and EOM are normal.  Neck: Normal range of motion. Neck supple.  Cardiovascular: Normal rate, regular rhythm, normal heart sounds and intact distal pulses.   No murmur heard. Pulmonary/Chest: Effort normal and breath sounds normal. No respiratory distress. He has no wheezes. He has no rales.  Abdominal: Soft. Bowel sounds are normal. He exhibits  no distension. There is tenderness. There is no guarding.    No suprapubic or CVA tenderness   Musculoskeletal: Normal range of motion. He exhibits no deformity.  Lymphadenopathy:    He has no cervical adenopathy.  Neurological: He is alert and oriented to person, place, and time.  Skin: Skin is warm and dry. Capillary refill takes less than 2 seconds.  Psychiatric: He has a normal mood and affect. His behavior is normal. Judgment and thought content normal.  Nursing note and vitals reviewed.    ED Treatments / Results  Labs (all labs ordered are listed, but only abnormal results are displayed) Labs Reviewed  COMPREHENSIVE METABOLIC PANEL - Abnormal; Notable for the following:       Result Value   Glucose, Bld 149 (*)    BUN 23 (*)    Creatinine, Ser 2.32 (*)    Total Protein 6.4 (*)    GFR calc non Af Amer 31 (*)  GFR calc Af Amer 36 (*)    All other components within normal limits  CBC - Abnormal; Notable for the following:    RDW 15.7 (*)    All other components within normal limits  URINALYSIS, ROUTINE W REFLEX MICROSCOPIC - Abnormal; Notable for the following:    Protein, ur 100 (*)    Squamous Epithelial / LPF 0-5 (*)    All other components within normal limits  LIPASE, BLOOD  POC OCCULT BLOOD, ED    EKG  EKG Interpretation None       Radiology Dg Chest 2 View  Result Date: 02/10/2017 CLINICAL DATA:  Productive cough, chest pain, shortness of Breath EXAM: CHEST  2 VIEW COMPARISON:  01/06/2017 FINDINGS: Borderline heart size. Lungs are clear. No effusions or acute bony abnormality. IMPRESSION: No active cardiopulmonary disease. Electronically Signed   By: Charlett Nose M.D.   On: 02/10/2017 08:54    Procedures Procedures (including critical care time)  Medications Ordered in ED Medications  cloNIDine (CATAPRES) tablet 0.3 mg (not administered)  amLODipine (NORVASC) tablet 5 mg (not administered)  hydrALAZINE (APRESOLINE) tablet 25 mg (not administered)   gi cocktail (Maalox,Lidocaine,Donnatal) (30 mLs Oral Given 02/10/17 1118)     Initial Impression / Assessment and Plan / ED Course  I have reviewed the triage vital signs and the nursing notes.  Pertinent labs & imaging results that were available during my care of the patient were reviewed by me and considered in my medical decision making (see chart for details).  Clinical Course as of Feb 11 1239  Thu Feb 10, 2017  0959 Attempted to evaluate patient, not in room yet   [CG]    Clinical Course User Index [CG] Liberty Handy, PA-C   51 year old male with history of poorly controlled hypertension, polysubstance abuse presents to the ED for evaluation of abdominal bloating, distention, with mild left-sided abdominal discomfort for a few months. Worse with eating and when laying on either side. He drinks large amounts of alcohol daily and uses crack cocaine frequently. He was evaluated for similar symptoms in the two months ago, at that time CT abdomen/pelvis without diverticulosis, normal small bowel without thickening, appendix normal. He was discharged with omeprazole, which he never took. On exam patient is hypertensive, he has not taken his medication patients for blood pressure for a few days. He has very mild left-sided abdominal discomfort, no rigidity or distention noted. Exam overall reassuring. Considering poorly controlled GERD versus PUD vs gastritis vs pancreatitis. Doubt appendicitis or diverticulosis/diverticulitis as there is no fever, diarrhea, anorexia, melena and sxs have been occurring for months. He reports [facility] twice recently when he had a hard BM, hemoccult negative today.   CBC, lipase, urinalysis, Hemoccult negative today. CMP shows elevated creatinine at 2.32, this is not much different from patient's creatinine baseline. Chest x-ray was obtained. Patient mentioned chronic cough, he had left upper quadrant abdominal discomfort so wanted to rule out pneumonia. Chest  x-ray is negative and unchanged from previous study.   Final Clinical Impressions(s) / ED Diagnoses   Final diagnoses:  Generalized abdominal pain  Essential hypertension  Chronic kidney disease due to hypertension  Chronic GERD   Given reassuring exam, successful PO challenge, chronicity of symptoms I don't think this is a GI/surgical emergency. Considered appendicitis, pancreatitis, SBO, peritonitis, kidney stone, GI bleed, diverticulitis but HPI exam and work up not consistent with these. Most likely GERD/PUD/gastritis. Patient's home meds refilled. Referral to GI and [facility]. Patient was  notified about solid pulmonary nodule found on his CT scan 11/2016, advised he f/u with repeat scan.   New Prescriptions New Prescriptions   OMEPRAZOLE (PRILOSEC) 20 MG CAPSULE    Take 1 capsule (20 mg total) by mouth 2 (two) times daily before a meal.   RANITIDINE (ZANTAC) 150 MG TABLET    Take 1 tablet (150 mg total) by mouth 2 (two) times daily.       Liberty HandyGibbons, Wright Gravely J, PA-C 02/10/17 1240    Louis Yang, Mark, MD 02/16/17 [phone]

## 2017-02-10 NOTE — Discharge Instructions (Signed)
Your lab work was normal today.   Your CT abdomen/pelvis last month was normal.   I suspect your abdominal discomfort, distention, are from uncontrolled GERD. Possibly gastritis or ulcer.   Please take omeprazole, zantac and maalox. Avoid ibuprofen, alcohol, tobacco, spicy or fatty/greasy food.  Contact gastroenterologist and make an appointment within 2 weeks for reassessment.  Your blood pressure medications have been renewed.   [facility] to establish care with a primary care provider for regular, routine medical care.  This clinic accepts patients without medical insurance. A primary care provider can adjust your daily medications and give you refills.   Return to ED for worsening abdominal pain, nausea, vomiting, bloody diarrhea, decreased appetite , fevers  NOTE: Your CT scan on May 2018 showed a pulmonary nodule on your right lung. You need this to be re-evaluated with a repeat CT scan within 6-12 months to rule out cancer.

## 2017-02-10 NOTE — ED Notes (Addendum)
Patient walked to nurse's station stating that he "didn't know what was taking so long." Mumbled. Irritated. Stated "I'm just going to leave." Girlfriend still in room. RN informed family member that patient walking toward the exit. She also headed toward the exit. Patient refused to wait for discharge papers.

## 2017-02-10 NOTE — ED Notes (Signed)
Cup of ice water provided to patient for fluid challenge.  

## 2017-03-26 ENCOUNTER — Emergency Department (HOSPITAL_COMMUNITY)
Admission: EM | Admit: 2017-03-26 | Discharge: 2017-03-27 | Disposition: A | Discharge status: Other Acute Inpatient Hospital | Payer: Medicaid - Out of State | Attending: Emergency Medicine | Admitting: Emergency Medicine

## 2017-03-26 ENCOUNTER — Encounter (HOSPITAL_COMMUNITY): Payer: Self-pay | Admitting: *Deleted

## 2017-03-26 ENCOUNTER — Emergency Department (HOSPITAL_COMMUNITY): Payer: Medicaid - Out of State

## 2017-03-26 DIAGNOSIS — I10 Essential (primary) hypertension: Secondary | ICD-10-CM | POA: Insufficient documentation

## 2017-03-26 DIAGNOSIS — R45851 Suicidal ideations: Secondary | ICD-10-CM | POA: Insufficient documentation

## 2017-03-26 DIAGNOSIS — F17210 Nicotine dependence, cigarettes, uncomplicated: Secondary | ICD-10-CM | POA: Insufficient documentation

## 2017-03-26 DIAGNOSIS — I252 Old myocardial infarction: Secondary | ICD-10-CM | POA: Insufficient documentation

## 2017-03-26 DIAGNOSIS — F1920 Other psychoactive substance dependence, uncomplicated: Secondary | ICD-10-CM | POA: Insufficient documentation

## 2017-03-26 DIAGNOSIS — F1910 Other psychoactive substance abuse, uncomplicated: Secondary | ICD-10-CM

## 2017-03-26 DIAGNOSIS — Z79899 Other long term (current) drug therapy: Secondary | ICD-10-CM | POA: Insufficient documentation

## 2017-03-26 HISTORY — DX: Other psychoactive substance abuse, uncomplicated: F19.10

## 2017-03-26 LAB — COMPREHENSIVE METABOLIC PANEL
ALK PHOS: 67 U/L (ref 38–126)
ALT: 40 U/L (ref 17–63)
ANION GAP: 13 (ref 5–15)
AST: 44 U/L — ABNORMAL HIGH (ref 15–41)
Albumin: 4.3 g/dL (ref 3.5–5.0)
BILIRUBIN TOTAL: 1 mg/dL (ref 0.3–1.2)
BUN: 27 mg/dL — ABNORMAL HIGH (ref 6–20)
CALCIUM: 9.5 mg/dL (ref 8.9–10.3)
CO2: 20 mmol/L — ABNORMAL LOW (ref 22–32)
CREATININE: 2.52 mg/dL — AB (ref 0.61–1.24)
Chloride: 106 mmol/L (ref 101–111)
GFR, EST AFRICAN AMERICAN: 32 mL/min — AB (ref >60)
GFR, EST NON AFRICAN AMERICAN: 28 mL/min — AB (ref >60)
Glucose, Bld: 69 mg/dL (ref 65–99)
Potassium: 3.8 mmol/L (ref 3.5–5.1)
SODIUM: 139 mmol/L (ref 135–145)
TOTAL PROTEIN: 7.1 g/dL (ref 6.5–8.1)

## 2017-03-26 LAB — RAPID URINE DRUG SCREEN, HOSP PERFORMED
AMPHETAMINES: NOT DETECTED
BARBITURATES: NOT DETECTED
BENZODIAZEPINES: NOT DETECTED
Cocaine: POSITIVE — AB
Opiates: NOT DETECTED
TETRAHYDROCANNABINOL: NOT DETECTED

## 2017-03-26 LAB — CBC
HEMATOCRIT: 36.2 % — AB (ref 39.0–52.0)
Hemoglobin: 12.4 g/dL — ABNORMAL LOW (ref 13.0–17.0)
MCH: 28.3 pg (ref 26.0–34.0)
MCHC: 34.3 g/dL (ref 30.0–36.0)
MCV: 82.6 fL (ref 78.0–100.0)
Platelets: 273 10*3/uL (ref 150–400)
RBC: 4.38 MIL/uL (ref 4.22–5.81)
RDW: 16.5 % — ABNORMAL HIGH (ref 11.5–15.5)
WBC: 8.5 10*3/uL (ref 4.0–10.5)

## 2017-03-26 LAB — I-STAT TROPONIN, ED: Troponin i, poc: 0.02 ng/mL (ref 0.00–0.08)

## 2017-03-26 LAB — ETHANOL: Alcohol, Ethyl (B): 15 mg/dL — ABNORMAL HIGH (ref <5)

## 2017-03-26 NOTE — ED Notes (Signed)
Pt has not been taking his BP meds for 3 days

## 2017-03-26 NOTE — ED Triage Notes (Signed)
Pt is here for central CP x2 days with sob, diaphoresis, nausea and light headedness.  Pt reports that he would like help getting clean. Pt reports that he is a crack user and drinks heavily.  Last drink was prior to arrival in ED and last crack use was about 2 hours ago. Pt denies any SI/HI

## 2017-03-27 ENCOUNTER — Other Ambulatory Visit: Payer: Self-pay

## 2017-03-27 MED ORDER — MIRTAZAPINE 15 MG PO TABS
15.0000 mg | ORAL_TABLET | Freq: Every day | ORAL | Status: DC
  Administered 2017-03-27: 15 mg via ORAL
  Filled 2017-03-27: qty 1

## 2017-03-27 MED ORDER — LORAZEPAM 1 MG PO TABS
1.0000 mg | ORAL_TABLET | Freq: Once | ORAL | Status: AC
  Administered 2017-03-27: 1 mg via ORAL
  Filled 2017-03-27: qty 1

## 2017-03-27 MED ORDER — CLONIDINE HCL 0.2 MG PO TABS
0.3000 mg | ORAL_TABLET | Freq: Three times a day (TID) | ORAL | Status: DC
  Administered 2017-03-27 (×2): 0.3 mg via ORAL
  Filled 2017-03-27 (×2): qty 2

## 2017-03-27 MED ORDER — LORAZEPAM 2 MG/ML IJ SOLN: 0.0000 mg | Freq: Two times a day (BID) | INTRAMUSCULAR | Status: DC

## 2017-03-27 MED ORDER — LORAZEPAM 2 MG/ML IJ SOLN: 0.0000 mg | Freq: Four times a day (QID) | INTRAMUSCULAR | Status: DC

## 2017-03-27 MED ORDER — FAMOTIDINE 20 MG PO TABS
10.0000 mg | ORAL_TABLET | Freq: Every day | ORAL | Status: DC
  Administered 2017-03-27: 10 mg via ORAL
  Filled 2017-03-27: qty 1

## 2017-03-27 MED ORDER — PANTOPRAZOLE SODIUM 40 MG PO TBEC
40.0000 mg | DELAYED_RELEASE_TABLET | Freq: Every day | ORAL | Status: DC
  Administered 2017-03-27: 40 mg via ORAL
  Filled 2017-03-27: qty 1

## 2017-03-27 MED ORDER — VITAMIN B-1 100 MG PO TABS
100.0000 mg | ORAL_TABLET | Freq: Every day | ORAL | Status: DC
  Administered 2017-03-27: 100 mg via ORAL
  Filled 2017-03-27: qty 1

## 2017-03-27 MED ORDER — THIAMINE HCL 100 MG/ML IJ SOLN: 100.0000 mg | Freq: Every day | INTRAMUSCULAR | Status: DC

## 2017-03-27 MED ORDER — HYDRALAZINE HCL 25 MG PO TABS
25.0000 mg | ORAL_TABLET | Freq: Three times a day (TID) | ORAL | Status: DC
  Administered 2017-03-27 (×3): 25 mg via ORAL
  Filled 2017-03-27 (×3): qty 1

## 2017-03-27 MED ORDER — LORAZEPAM 1 MG PO TABS: 0.0000 mg | ORAL_TABLET | Freq: Two times a day (BID) | ORAL | Status: DC

## 2017-03-27 MED ORDER — NICOTINE POLACRILEX 2 MG MT GUM
2.0000 mg | CHEWING_GUM | OROMUCOSAL | Status: DC | PRN
  Filled 2017-03-27: qty 1

## 2017-03-27 MED ORDER — LORAZEPAM 1 MG PO TABS: 0.0000 mg | ORAL_TABLET | Freq: Four times a day (QID) | ORAL | Status: DC

## 2017-03-27 MED ORDER — AMLODIPINE BESYLATE 5 MG PO TABS
10.0000 mg | ORAL_TABLET | Freq: Every day | ORAL | Status: DC
Start: 2017-03-27 — End: 2017-03-27
  Administered 2017-03-27: 10 mg via ORAL
  Filled 2017-03-27: qty 2

## 2017-03-27 NOTE — ED Notes (Signed)
Re-TTS being performed.  

## 2017-03-27 NOTE — Progress Notes (Signed)
Patient's case has been discussed with psychiatry team at [facility]. However, no beds available for patient at this moment.  Patient has been referred to the following inpatient psychiatric treatment facilities: [facility], [facility], [address], [place], [place], [place].  CSW in disposition will continue to follow up in efforts to secure placement for patient.  Melbourne Abts, MSW, LCSWA Clinical social worker in disposition [facility], TTS Office 03/27/2017 1:33 PM
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
## 2017-03-27 NOTE — [facility] Counselor (Signed)
Pt accepted to [facility] -- no room number yet.  Can come whenever he is ready.  Accepting provider is Dr. Daphane Shepherd.  Attending is same.  Call report to [phone].
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
## 2017-03-27 NOTE — ED Notes (Signed)
Pt voiced understanding and agreement w/tx plan - accepted to [facility] - Dr Daphane Shepherd - Dr Fredderick Phenix aware.

## 2017-03-27 NOTE — Progress Notes (Signed)
Patient is being reviewed at [facility].  Melbourne Abts, MSW, LCSWA Clinical social worker in disposition [facility], TTS Office [phone] and [phone] 03/27/2017 12:03 PM

## 2017-03-27 NOTE — ED Notes (Signed)
Pt noted to be sleeping - respirations even, unlabored. Snorous respirations noted intermittently. No tremors, restlessness noted. Woke pt to ask about snack. States he is experiencing shaking. Asked pt drug use - states "Anything I can get my hands on". States drinks ETOH all day until he passes out - would not specify type or amount. Pt then returned to sleeping during conversation. No tremors, sweating noted.

## 2017-03-27 NOTE — ED Notes (Signed)
Pt briefly woke to take meds and for CIWA to be performed then returned to sleeping.

## 2017-03-27 NOTE — ED Notes (Signed)
Regular Diet ordered for Dinner. 
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
## 2017-03-27 NOTE — ED Notes (Signed)
ED Provider at bedside. 

## 2017-03-27 NOTE — ED Notes (Signed)
Eating snack given.

## 2018-12-10 IMAGING — CT CT ABD-PELV W/O CM
2 of 4 series · 16 of 46 positions shown, 18 images · non-contrast
Comparison: 05/11/2016 abdominal radiograph.

CLINICAL DATA: Generalized abdominal pain, vomiting and
constipation.

EXAM:
CT ABDOMEN AND PELVIS WITHOUT CONTRAST
TECHNIQUE: Multidetector CT imaging of the abdomen and pelvis was performed
following the standard protocol without IV contrast.

[Series 2: abd/pel w/o · axial · non-contrast · 0.82mm/px · z∈[-394,+20]mm · 13 of 93 slices shown, 15 images]
[im 5/93  soft-tissue]
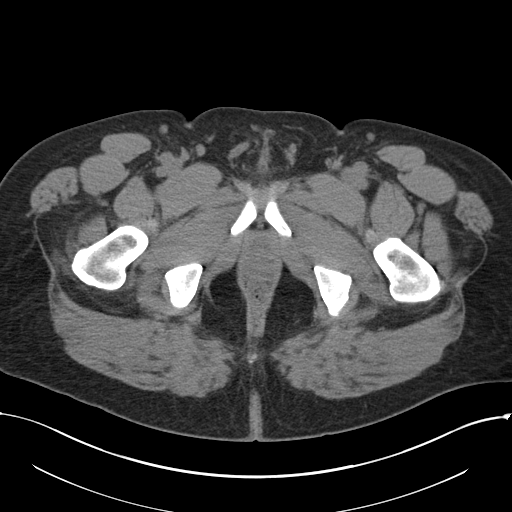
[im 5/93  bone]
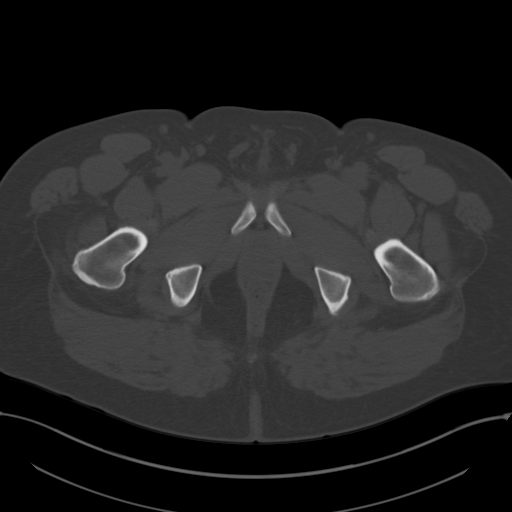
[im 15/93  soft-tissue]
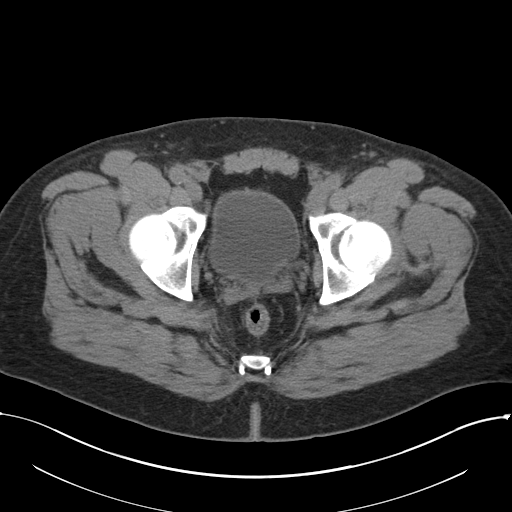
[im 20/93  soft-tissue]
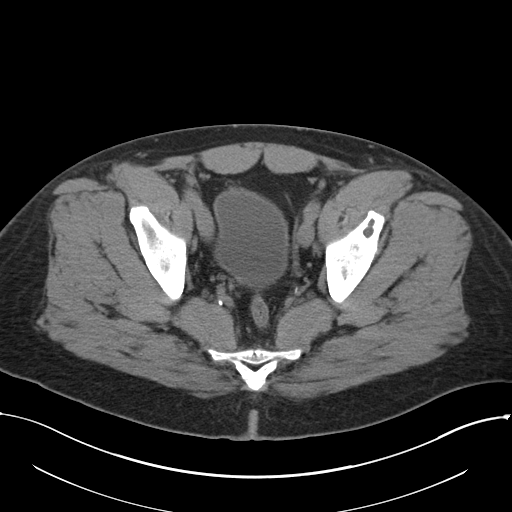
[im 25/93  soft-tissue]
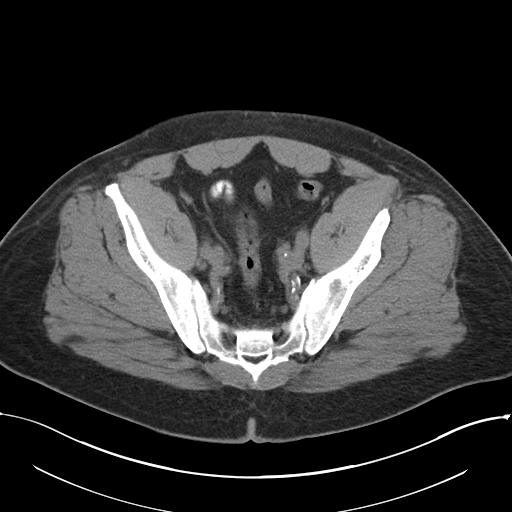
[im 34/93  soft-tissue]
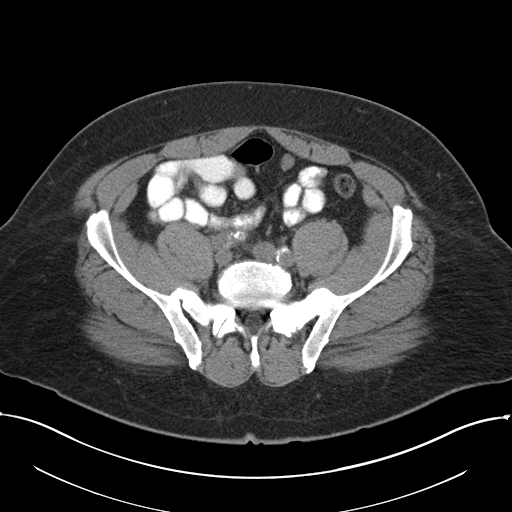
[im 39/93  soft-tissue]
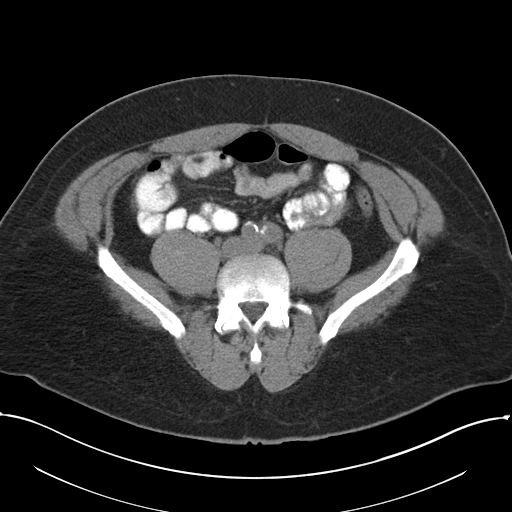
[im 49/93  soft-tissue]
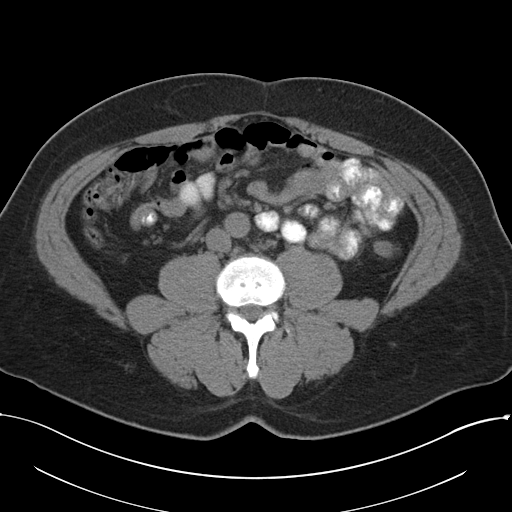
[im 54/93  soft-tissue]
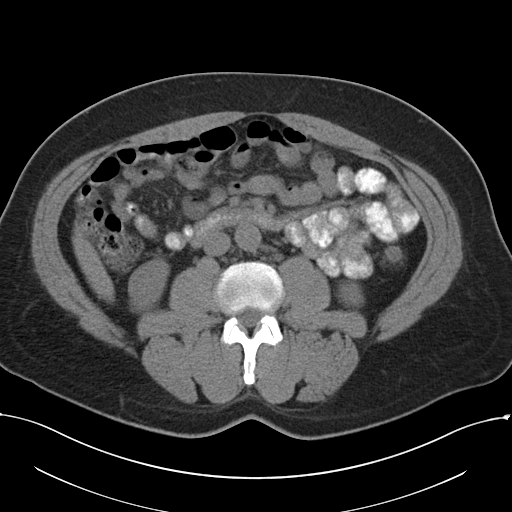
[im 59/93  soft-tissue]
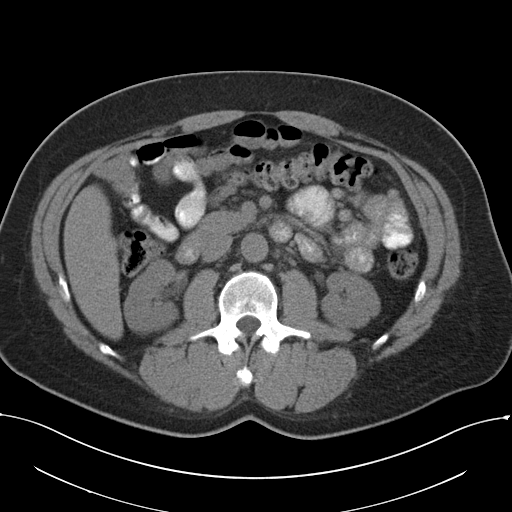
[im 59/93  bone]
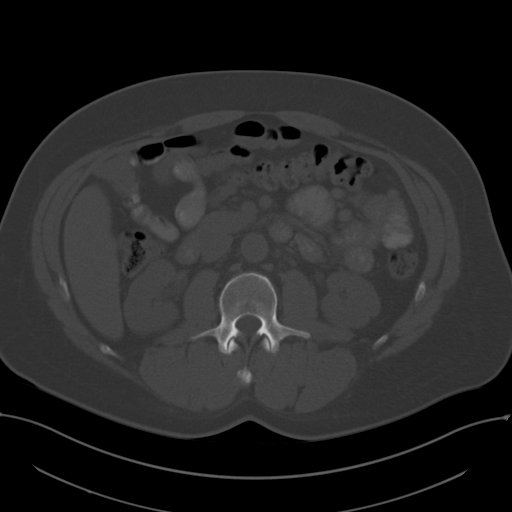
[im 68/93  soft-tissue]
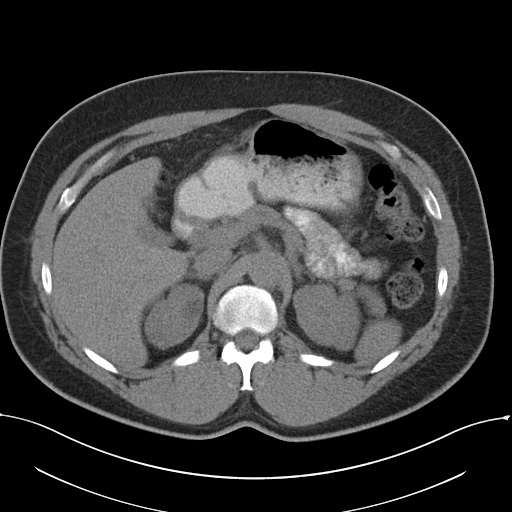
[im 73/93  soft-tissue]
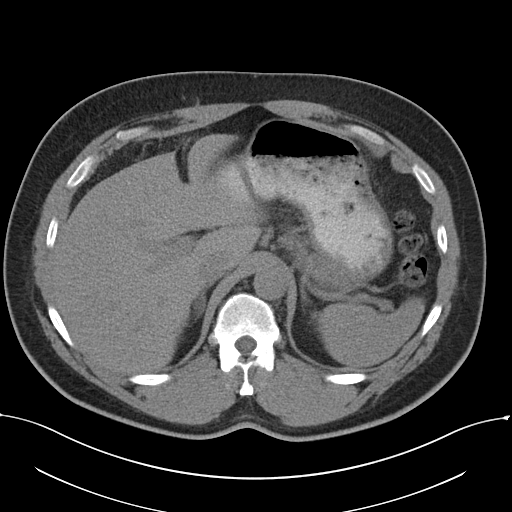
[im 78/93  soft-tissue]
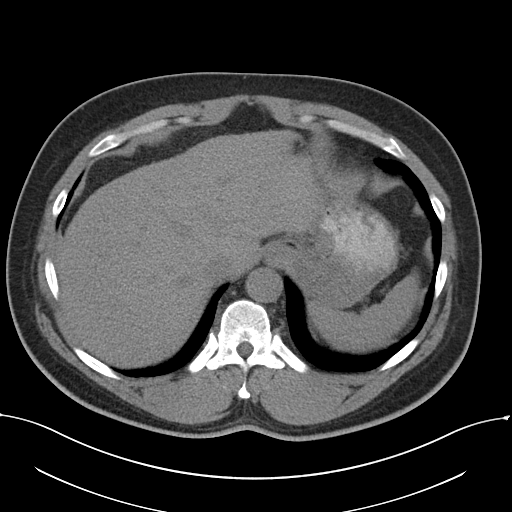
[im 88/93  soft-tissue]
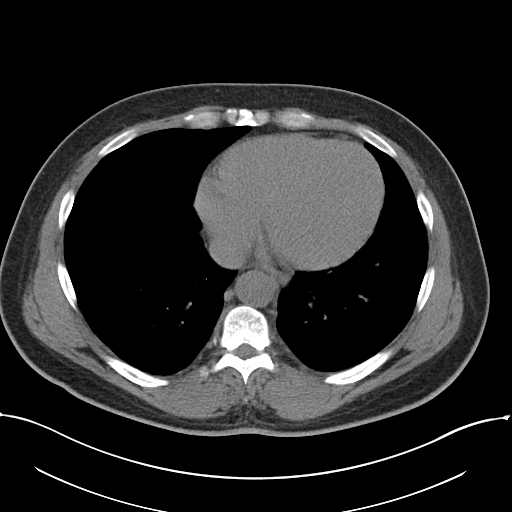

[Series 5: coronal · coronal · 0.82mm/px · 3 of 152 slices shown]
[im 51/152  soft-tissue]
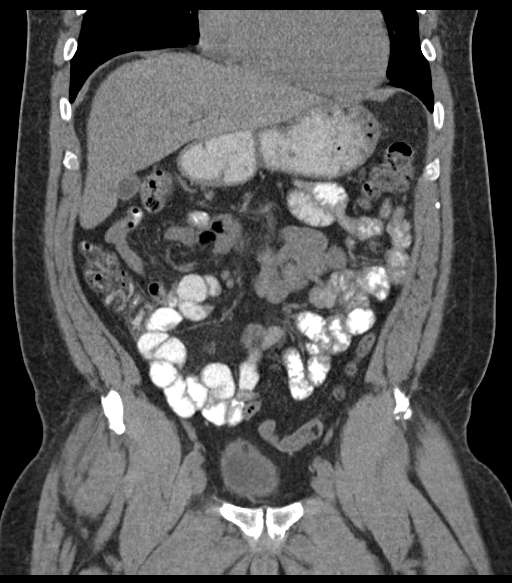
[im 68/152  soft-tissue]
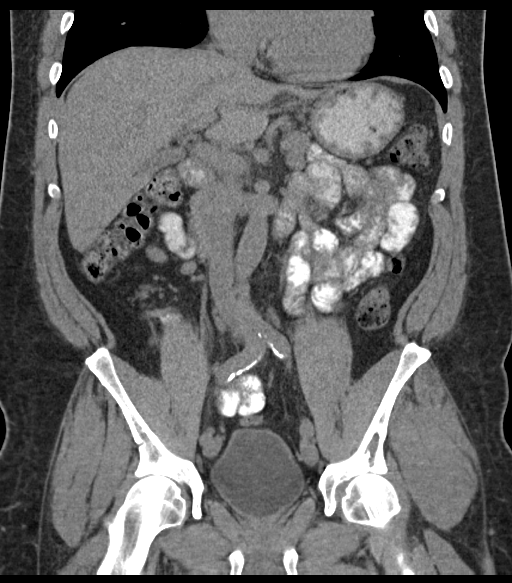
[im 84/152  soft-tissue]
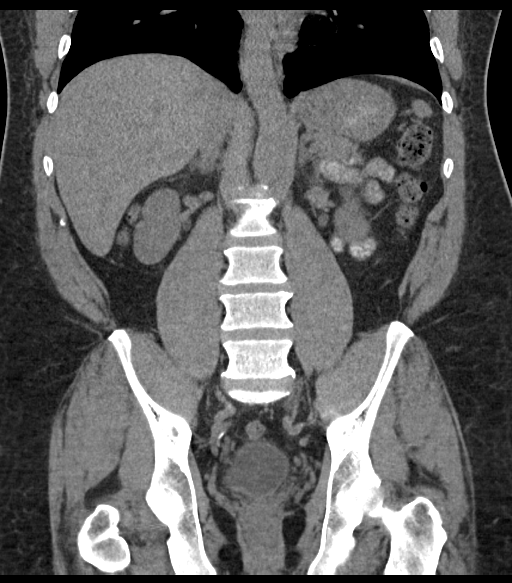

[16 of 46 positions shown; findings below may reference images not displayed]

FINDINGS: Lower chest: Solid subpleural 3 mm anterior right middle lobe
pulmonary nodule (series 4/image 5).

Hepatobiliary: Normal liver size. Hypodense 0.5 cm lateral segment
left liver lobe lesion, too small to characterize, for which no
further follow-up is required unless the patient has risk factors
for liver malignancy. No additional liver lesions. Normal
gallbladder with no radiopaque cholelithiasis. No biliary ductal
dilatation.

Pancreas: Normal, with no mass or duct dilation.

Spleen: Normal size. No mass.

Adrenals/Urinary Tract: Left adrenal 1.2 cm adenoma with density -6
HU. Normal right adrenal. Simple 1.7 cm medial upper right renal
cyst. Otherwise no contour deforming renal lesions. Nonobstructing 2
mm upper left renal stone. No additional renal stones. No
hydronephrosis. Normal caliber ureters, with no ureteral stones.
There is a tiny 0.5 cm low-attenuation focus in the midline bladder
wall at the dome (series 2/ image 73), which has the appearance of
minimal focal fatty change on the coronal sequence, unlikely to be
clinically significant. Otherwise normal bladder.

Stomach/Bowel: Grossly normal stomach. Normal caliber small bowel
with no small bowel wall thickening. Normal appendix. Normal large
bowel with no diverticulosis, large bowel wall thickening or
pericolonic fat stranding. Oral contrast progresses to the proximal
colon.

Vascular/Lymphatic: Atherosclerotic nonaneurysmal abdominal aorta.
No pathologically enlarged lymph nodes in the abdomen or pelvis.

Reproductive: Normal size prostate.

Other: No pneumoperitoneum, ascites or focal fluid collection.

Musculoskeletal: No aggressive appearing focal osseous lesions. Mild
thoracolumbar spondylosis.
IMPRESSION: 1. No acute abnormality. No evidence of bowel obstruction or acute
bowel inflammation.
2. Tiny nonobstructing left renal stone.  No hydronephrosis.
3. Aortic atherosclerosis.
4. Left adrenal adenoma.
5. Right middle lobe subpleural 3 mm solid pulmonary nodule,
probably benign . No follow-up needed if patient is low-risk.
Non-contrast chest CT can be considered in 12 months if patient is
high-risk. This recommendation follows the consensus statement:
Guidelines for Management of Incidental Pulmonary Nodules Detected
